# Patient Record
Sex: Female | Born: 1970
Health system: Southern US, Community
[De-identification: ages and names within clinical notes are randomized; demographics above are authoritative.]

## PROBLEM LIST (undated history)

## (undated) DIAGNOSIS — I739 Peripheral vascular disease, unspecified: Secondary | ICD-10-CM

## (undated) DIAGNOSIS — K219 Gastro-esophageal reflux disease without esophagitis: Secondary | ICD-10-CM

## (undated) DIAGNOSIS — K760 Fatty (change of) liver, not elsewhere classified: Secondary | ICD-10-CM

## (undated) DIAGNOSIS — B9681 Helicobacter pylori [H. pylori] as the cause of diseases classified elsewhere: Secondary | ICD-10-CM

## (undated) DIAGNOSIS — I370 Nonrheumatic pulmonary valve stenosis: Secondary | ICD-10-CM

## (undated) DIAGNOSIS — R011 Cardiac murmur, unspecified: Secondary | ICD-10-CM

## (undated) DIAGNOSIS — K2970 Gastritis, unspecified, without bleeding: Secondary | ICD-10-CM

## (undated) DIAGNOSIS — L989 Disorder of the skin and subcutaneous tissue, unspecified: Secondary | ICD-10-CM

## (undated) DIAGNOSIS — Q256 Stenosis of pulmonary artery: Secondary | ICD-10-CM

## (undated) DIAGNOSIS — Z9889 Other specified postprocedural states: Secondary | ICD-10-CM

## (undated) DIAGNOSIS — I272 Pulmonary hypertension, unspecified: Secondary | ICD-10-CM

## (undated) DIAGNOSIS — R0609 Other forms of dyspnea: Secondary | ICD-10-CM

## (undated) DIAGNOSIS — R0602 Shortness of breath: Secondary | ICD-10-CM

## (undated) DIAGNOSIS — K589 Irritable bowel syndrome without diarrhea: Secondary | ICD-10-CM

## (undated) HISTORY — DX: Fatty (change of) liver, not elsewhere classified: K76.0

## (undated) HISTORY — DX: Gastritis, unspecified, without bleeding: K29.70

## (undated) HISTORY — DX: Peripheral vascular disease, unspecified: I73.9

## (undated) HISTORY — DX: Stenosis of pulmonary artery: Q25.6

## (undated) HISTORY — DX: Shortness of breath: R06.02

## (undated) HISTORY — PX: DILATION AND CURETTAGE OF UTERUS: SHX78

## (undated) HISTORY — DX: Other specified postprocedural states: Z98.890

## (undated) HISTORY — DX: Cardiac murmur, unspecified: R01.1

## (undated) HISTORY — DX: Helicobacter pylori (H. pylori) as the cause of diseases classified elsewhere: B96.81

## (undated) HISTORY — DX: Other forms of dyspnea: R06.09

## (undated) HISTORY — PX: TONSILLECTOMY: SUR1361

## (undated) HISTORY — DX: Nonrheumatic pulmonary valve stenosis: I37.0

## (undated) HISTORY — DX: Disorder of the skin and subcutaneous tissue, unspecified: L98.9

## (undated) HISTORY — DX: Irritable bowel syndrome, unspecified: K58.9

## (undated) HISTORY — DX: Gastro-esophageal reflux disease without esophagitis: K21.9

## (undated) HISTORY — PX: OTHER SURGICAL HISTORY: SHX169

## (undated) HISTORY — DX: Pulmonary hypertension, unspecified: I27.20

## (undated) HISTORY — PX: ENDOMETRIAL ABLATION: SHX621

---
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
## 1995-09-01 HISTORY — PX: CHOLECYSTECTOMY: SHX55

## 2001-03-22 ENCOUNTER — Encounter: Payer: Self-pay | Admitting: Pediatrics

## 2001-03-22 ENCOUNTER — Ambulatory Visit (HOSPITAL_COMMUNITY): Admission: RE | Admit: 2001-03-22 | Discharge: 2001-03-22 | Payer: Self-pay | Admitting: Pediatrics

## 2001-04-21 ENCOUNTER — Ambulatory Visit (HOSPITAL_COMMUNITY): Admission: RE | Admit: 2001-04-21 | Discharge: 2001-04-21 | Payer: Self-pay | Admitting: Internal Medicine

## 2001-04-21 ENCOUNTER — Encounter (INDEPENDENT_AMBULATORY_CARE_PROVIDER_SITE_OTHER): Payer: Self-pay | Admitting: Internal Medicine

## 2002-07-13 ENCOUNTER — Ambulatory Visit (HOSPITAL_COMMUNITY): Admission: RE | Admit: 2002-07-13 | Discharge: 2002-07-13 | Payer: Self-pay | Admitting: Pediatrics

## 2002-07-13 ENCOUNTER — Encounter: Payer: Self-pay | Admitting: Pediatrics

## 2002-10-20 ENCOUNTER — Ambulatory Visit (HOSPITAL_COMMUNITY): Admission: RE | Admit: 2002-10-20 | Discharge: 2002-10-20 | Payer: Self-pay | Admitting: Pediatrics

## 2002-10-20 ENCOUNTER — Encounter: Payer: Self-pay | Admitting: Pediatrics

## 2003-03-07 ENCOUNTER — Encounter: Payer: Self-pay | Admitting: Pediatrics

## 2003-03-07 ENCOUNTER — Ambulatory Visit (HOSPITAL_COMMUNITY): Admission: RE | Admit: 2003-03-07 | Discharge: 2003-03-07 | Payer: Self-pay | Admitting: Pediatrics

## 2003-03-08 ENCOUNTER — Ambulatory Visit (HOSPITAL_COMMUNITY): Admission: RE | Admit: 2003-03-08 | Discharge: 2003-03-08 | Payer: Self-pay | Admitting: Pediatrics

## 2003-03-08 ENCOUNTER — Encounter: Payer: Self-pay | Admitting: Pediatrics

## 2003-09-01 DIAGNOSIS — Z9889 Other specified postprocedural states: Secondary | ICD-10-CM

## 2003-09-01 DIAGNOSIS — B9681 Helicobacter pylori [H. pylori] as the cause of diseases classified elsewhere: Secondary | ICD-10-CM

## 2003-09-01 HISTORY — DX: Helicobacter pylori (H. pylori) as the cause of diseases classified elsewhere: B96.81

## 2003-09-01 HISTORY — DX: Other specified postprocedural states: Z98.890

## 2003-10-22 ENCOUNTER — Ambulatory Visit (HOSPITAL_COMMUNITY): Admission: RE | Admit: 2003-10-22 | Discharge: 2003-10-22 | Payer: Self-pay | Admitting: Internal Medicine

## 2004-01-31 ENCOUNTER — Other Ambulatory Visit: Admission: RE | Admit: 2004-01-31 | Discharge: 2004-01-31 | Payer: Self-pay | Admitting: Obstetrics and Gynecology

## 2004-03-31 ENCOUNTER — Ambulatory Visit (HOSPITAL_COMMUNITY): Admission: RE | Admit: 2004-03-31 | Discharge: 2004-03-31 | Payer: Self-pay | Admitting: Obstetrics and Gynecology

## 2004-03-31 ENCOUNTER — Encounter (INDEPENDENT_AMBULATORY_CARE_PROVIDER_SITE_OTHER): Payer: Self-pay | Admitting: Specialist

## 2004-06-05 ENCOUNTER — Ambulatory Visit (HOSPITAL_COMMUNITY): Admission: RE | Admit: 2004-06-05 | Discharge: 2004-06-05 | Payer: Self-pay | Admitting: Internal Medicine

## 2004-09-09 ENCOUNTER — Other Ambulatory Visit: Admission: RE | Admit: 2004-09-09 | Discharge: 2004-09-09 | Payer: Self-pay | Admitting: Obstetrics and Gynecology

## 2005-03-13 ENCOUNTER — Inpatient Hospital Stay (HOSPITAL_COMMUNITY): Admission: AD | Admit: 2005-03-13 | Discharge: 2005-03-13 | Payer: Self-pay | Admitting: Obstetrics and Gynecology

## 2005-03-28 ENCOUNTER — Inpatient Hospital Stay (HOSPITAL_COMMUNITY): Admission: AD | Admit: 2005-03-28 | Discharge: 2005-03-30 | Payer: Self-pay | Admitting: Obstetrics and Gynecology

## 2005-05-07 ENCOUNTER — Other Ambulatory Visit: Admission: RE | Admit: 2005-05-07 | Discharge: 2005-05-07 | Payer: Self-pay | Admitting: Obstetrics and Gynecology

## 2006-06-02 ENCOUNTER — Ambulatory Visit: Payer: Self-pay | Admitting: Internal Medicine

## 2006-06-07 ENCOUNTER — Ambulatory Visit: Payer: Self-pay | Admitting: Internal Medicine

## 2006-06-14 ENCOUNTER — Encounter (HOSPITAL_COMMUNITY): Admission: RE | Admit: 2006-06-14 | Discharge: 2006-07-14 | Payer: Self-pay | Admitting: Internal Medicine

## 2007-02-03 ENCOUNTER — Encounter (HOSPITAL_COMMUNITY): Admission: RE | Admit: 2007-02-03 | Discharge: 2007-03-05 | Payer: Self-pay | Admitting: Sports Medicine

## 2007-03-07 ENCOUNTER — Encounter (HOSPITAL_COMMUNITY): Admission: RE | Admit: 2007-03-07 | Discharge: 2007-04-06 | Payer: Self-pay | Admitting: Sports Medicine

## 2007-10-10 ENCOUNTER — Ambulatory Visit (HOSPITAL_COMMUNITY): Admission: RE | Admit: 2007-10-10 | Discharge: 2007-10-10 | Payer: Self-pay | Admitting: *Deleted

## 2008-05-10 ENCOUNTER — Ambulatory Visit: Payer: Self-pay | Admitting: Gastroenterology

## 2008-05-25 ENCOUNTER — Ambulatory Visit (HOSPITAL_COMMUNITY): Admission: RE | Admit: 2008-05-25 | Discharge: 2008-05-25 | Payer: Self-pay | Admitting: Gastroenterology

## 2008-05-25 ENCOUNTER — Ambulatory Visit: Payer: Self-pay | Admitting: Gastroenterology

## 2008-05-25 ENCOUNTER — Encounter: Payer: Self-pay | Admitting: Gastroenterology

## 2008-05-25 HISTORY — PX: ESOPHAGOGASTRODUODENOSCOPY: SHX1529

## 2009-01-31 ENCOUNTER — Encounter (HOSPITAL_COMMUNITY): Admission: RE | Admit: 2009-01-31 | Discharge: 2009-03-02 | Payer: Self-pay | Admitting: Orthopedic Surgery

## 2009-05-29 ENCOUNTER — Encounter: Payer: Self-pay | Admitting: Gastroenterology

## 2010-09-11 ENCOUNTER — Encounter (INDEPENDENT_AMBULATORY_CARE_PROVIDER_SITE_OTHER): Payer: Self-pay | Admitting: *Deleted

## 2010-10-02 NOTE — Letter (Signed)
Summary: Recall Office Visit  [facility]  [address]   [place], [place] [address]   Phone: [phone]  Fax: [phone]      September 11, 2010   Lisa Garner [address] [place], [place]  [address] [birth year]   Dear Ms. Kondo,   According to our records, it is time for you to schedule a follow-up office visit with [place].   At your convenience, please call [phone] to schedule an office visit. If you have any questions, concerns, or feel that this letter is in error, we would appreciate your call.   Sincerely,    Diana Eves  [facility] Ph: [phone]   Fax: [phone]

## 2010-11-21 ENCOUNTER — Other Ambulatory Visit: Payer: Self-pay | Admitting: Obstetrics and Gynecology

## 2011-01-13 NOTE — Op Note (Signed)
Lisa Garner, Lisa Garner NO.:  [id number]   MEDICAL RECORD NO.:  [id number]          PATIENT TYPE:  AMB   LOCATION:  DAY                           FACILITY:  [facility]   PHYSICIAN:  Kassie Mends, M.D.      DATE OF BIRTH:  09-Jul-1971   DATE OF PROCEDURE:  DATE OF DISCHARGE:                                PROCEDURE NOTE   REFERRING PHYSICIAN:  Francoise Schaumann. Halm, DO, FAAP   PROCEDURE:  Esophagogastroduodenoscopy with cold forceps biopsy of the  gastric and duodenal mucosa.   INDICATION FOR EXAM:  Lisa Garner is a 40 year old female who has  persistent epigastric abdominal pain.  She has a significant past  medical history of irritable bowel syndrome and cholecystectomy.  An  ERCP for common bile duct stones and gallstones.  Recent lab evaluation  showed a normal hemoglobin and hepatic function panel.  She had a  gastric emptying study in October 2007 due to persistent nausea which  was normal.  Her last imaging study was in July 2004 for left flank  pain, and she had no acute abdominal or pelvic process. Her body mass  index is 35.5 (severely obese).   FINDINGS:  1. Normal esophagus without evidence of Barrett, mass, erosion,      ulceration, or stricture.  2. Patchy erythema in the antrum without erosion or ulceration.      Biopsies obtained via cold forceps to evaluate for H. pylori      gastritis for eosinophilic gastritis.  3. Normal duodenal bulb and second portion of the duodenum with      moderate bile staining.  Biopsies obtained via cold forceps to      evaluate for eosinophilic duodenitis or celiac sprue.   DIAGNOSES:  The most likely etiology for Lisa Garner's abdominal pain  is irritable bowel syndrome, constipation predominant.  The differential  diagnosis includes H. pylori gastritis, eosinophilic gastroenteritis, or  celiac sprue.   RECOMMENDATIONS:  1. She should continue the omeprazole.  2. Will call her with the results of her biopsies.  She  should have a      follow up appointment to see Lisa Garner in 6 weeks regarding her      abdominal pain.  She should follow a high-fiber diet.  She should      drink 6-8 the cups of water daily.  She should add Benefiber daily.  3. No aspirin or NSAIDs for 30 days.  She should avoid gastric      irritants.  No anticoagulation for 7 days.   MEDICATIONS:  1. Demerol 75 mg IV.  2. Versed 3 mg IV.   PROCEDURE TECHNIQUE:  Physical exam was performed.  Informed consent was  obtained from the patient explaining the benefits, risks, and  alternatives to the procedure.  The patient was connected to monitor and  placed in the left lateral position.  Continuous oxygen was provided by  nasal cannula.  IV medicine administered through an indwelling cannula.  After administration of sedation, the patient's esophagus was intubated  and the scope was advanced under direct visualization to the second  portion  of the duodenum.  The scope was removed slowly by carefully  examining the color, texture, anatomy, and integrity of the mucosa on  the way out.  The patient was recovered in endoscopy and discharged home  in satisfactory condition.   PATH:  Nl small bowel. Reactive gastropathy.      Kassie Mends, M.D.  Electronically Signed     SM/MEDQ  D:  05/25/2008  T:  05/25/2008  Job:  [id number]   cc:   Francoise Schaumann. Milford Cage DO, FAAP  Fax: [phone]

## 2011-01-13 NOTE — H&P (Signed)
NAME:  EVANEE, LUBRANO NO.:  [id number]   MEDICAL RECORD NO.:  [id number]          PATIENT TYPE:  AMB   LOCATION:  DAY                           FACILITY:  [facility]   PHYSICIAN:  Kassie Mends, M.D.      DATE OF BIRTH:  09/07/1970   DATE OF ADMISSION:  DATE OF DISCHARGE:  LH                              HISTORY & PHYSICAL   PRIMARY CARE PHYSICIAN:  Dr. Milford Cage.   CHIEF COMPLAINT:  Chronic nausea and epigastric pain.   HISTORY OF PRESENT ILLNESS:  Ms. Nghiem is a 40 year old Caucasian  female.  She was previously evaluated by Dr. Karilyn Cota.  Workup in the past  has included a gastric emptying study in 2007 which was normal.  She has  been treated for H. pylori gastritis back in 2005.  She had an EGD in  2005 by Dr. Karilyn Cota where she had hyperplastic polyp removed but was  otherwise normal.  She had negative small bowel follow-through in 2002  and a negative CT of the abdomen and pelvis with contrast in 2005 and a  negative celiac antibody previously.  More recently she has had a 3 week  history of severe epigastric discomfort.  She describes it as a balling  up type pain and points to her epigastrium.  She says it feels like it  could possibly be a spasm.  She has episodes of pain at least once per  month.  She describes the pain as excruciating and rates it 10/10 on a  pain scale.  It does not seem to be associated with eating or exercise  or movement, however, she has had at times when she bends over noticed  the onset of pain.  She complains of chronic nausea.  She denies any  vomiting.  The pain does not radiate to her back or shoulders.  She  denies any water brash or significant heartburn or indigestion.  She  denies any anorexia.  She has had fatigue for a couple of years.  She  has seen Dr. Domingo Sep for pulmonary stenosis which was congenital.  She  denies any rectal bleeding or melena.  She has been off of PPI and did  resume omeprazole 20 mg daily about 1 week  ago which does seem to help  some.   PAST MEDICAL AND SURGICAL HISTORY:  She had an ERCP with sphincterotomy  and stone extraction and cholecystectomy in 1997.  She has congenital  pulmonary stenosis and she has a history of IBS, D&C, tonsillectomy and  knee surgery.  She has had GI workup as above.   CURRENT MEDICATIONS:  1. Maxalt p.r.n.  2. Calcium once daily.  3. Magnesium once daily.  4. Multivitamin daily.  5. Omeprazole 20 mg daily.   ALLERGIES:  LEVBID.   FAMILY HISTORY:  There is no known family history of colorectal  carcinoma or liver problems.  Her father does have history of celiac  disease diagnosed late in life.  Mother has history of adenomatous colon  polyps.  She has three healthy siblings.   SOCIAL HISTORY:  Ms. Persky is married.  She has  two children ages 41  and 36.  She is the Nurse, adult for [employer] .  She denies any  tobacco or drug use.  She consumes a couple of alcoholic beverages per  month.   REVIEW OF SYSTEMS:  See HPI.  EXTREMITIES:  She has had some lower  extremity weakness and pain.  She has been seen by Dr. Domingo Sep.  She  did have a workup which was negative for blood clots.  She tells me  she had Dopplers to both legs as well as an abdominal ultrasound through  Dr. Roque Lias office.  Otherwise, negative review of systems.  See HPI.   PHYSICAL EXAMINATION:  VITAL SIGNS:  Weight 207 pounds, height 54  inches, temperature 97.8, blood pressure 104/68 and pulse 60.  GENERAL:  She is an obese pale-appearing Caucasian female who is alert  and pleasant, cooperative and in no acute distress.  HEENT:  Sclerae clear and nonicteric.  Conjunctivae pink.  Oropharynx  pink and moist without lesions.  NECK:  Supple without mass or thyromegaly.  CHEST:  Heart regular rate and rhythm.  Normal S1-S2.  No murmurs,  clicks, rubs or gallops.  LUNGS:  Clear to auscultation bilaterally.  ABDOMEN:  Positive bowel sounds x4.  No bruits auscultated.   Soft,  nontender, nondistended.  No palpable mass or splenomegaly.  No rebound  tenderness or guarding.  EXTREMITIES:  Without clubbing or edema.   IMPRESSION:  Ms. Chernick is a 40 year old Caucasian female with a 3  week history of epigastric pain.  She has normally had episodes of  epigastric pain about once per month.  This pain is excruciating.  It is  sometimes worsened with bending over.  It is also associated with  chronic nausea without emesis.  She has had minimal relief with PPI and  extensive GI workup previously.  She has dyspepsia which could be  related to peptic ulcer disease or non-ulcer dyspepsia.  She is status  post cholecystectomy, ERCP with sphincterotomy and stone extraction back  in 1997.   PLAN:  1. Omeprazole 20 mg daily.  2. EGD with Dr. Cira Servant in the near future.  Discussed the procedure      risks and benefits, which include but are not limited to bleeding,      infection, perforation, drug reaction and she agrees and informed      consent will be obtained.  3. CBC, met-7 and LFTs today.      [name], N.P.      Kassie Mends, M.D.  Electronically Signed    KJ/MEDQ  D:  05/10/2008  T:  05/10/2008  Job:  [id number]   cc:   Francoise Schaumann. Milford Cage DO, FAAP  Fax: [phone]

## 2011-01-16 NOTE — Op Note (Signed)
NAME:  Lisa Garner, Lisa Garner                     ACCOUNT NO.:  [id number]   MEDICAL RECORD NO.:  [id number]                   PATIENT TYPE:  AMB   LOCATION:  SDC                                  FACILITY:  [facility]   PHYSICIAN:  Michelle L. Vincente Poli, M.D.            DATE OF BIRTH:  Mar 25, 1971   DATE OF PROCEDURE:  03/31/2004  DATE OF DISCHARGE:  03/31/2004                                 OPERATIVE REPORT   PREOPERATIVE DIAGNOSIS:  Missed abortion.   POSTOPERATIVE DIAGNOSIS:  Missed abortion.   OPERATION PERFORMED:  Dilation and evacuation.   SURGEON:  Michelle L. Vincente Poli, M.D.   ANESTHESIA:  MAC with paracervical block.   ESTIMATED BLOOD LOSS:  Less than 50 mL.   DESCRIPTION OF PROCEDURE:  The patient was taken to the operating room.  She  was given sedation and placed in lithotomy position.  She was prepped and  draped in the usual sterile fashion.  In and out catheter was used to empty  the bladder.  A speculum was inserted into the vagina and the cervix was  grasped with a tenaculum and a paracervical block was performed in the  standard fashion.  The uterus was sounded to 8 cm and was in the midline  position.  The cervical internal os was gently dilated using Pratt dilators.  A #7 suction cannula was inserted into the uterus and suction curettage was  obtained with retrieval of contents grossly consistent with products of  conception.  This was performed two times.  The suction cannula was removed  and a sharp curet was then inserted and the uterus was thoroughly curetted  until no final tissue was noted.  A final suction curettage was then  performed.  At the end of the procedure, there was no vaginal bleeding  noted.  All sponge, lap and instrument counts were correct times two.  The  patient went to the recovery room in stable condition.  Of note, the patient  has a history of pulmonary artery stenosis and was given antibiotics prior  to the procedure and Lisa Garner be sent home with  antibiotics to take this evening  as well as Tylenol with codeine.                                               Michelle L. Vincente Poli, M.D.    Florestine Avers  D:  04/02/2004  T:  04/02/2004  Job:  [id number]

## 2011-01-16 NOTE — Op Note (Signed)
NAME:  KELLIN, FIFER                     ACCOUNT NO.:  [id number]   MEDICAL RECORD NO.:  [id number]                   PATIENT TYPE:  AMB   LOCATION:  SDC                                  [facility]   PHYSICIAN:  Michelle L. Vincente Poli, M.D.            DATE OF BIRTH:  Mar 06, 1971   DATE OF PROCEDURE:  DATE OF DISCHARGE:                                 OPERATIVE REPORT   __________________   The suction curet was removed and then with a sharp curet the uterus was  thoroughly curetted.  All four uterine walls were gritty.  We then performed  a final suction curettage for very scant tissue and debris.  At the end of  the procedure, there was no vaginal bleeding noted.  All sponge, lap and  instrument counts were correct x2.  The patient went to the recovery room in  stable condition.  Of note, ____________.  The patient was given antibiotics  during the procedure and will be sent home with antibiotics to take  postoperatively, as well as Methergine and Tylenol.                                               Michelle L. Vincente Poli, M.D.    Florestine Avers  D:  03/31/2004  T:  03/31/2004  Job:  [id number]

## 2011-01-16 NOTE — Consult Note (Signed)
Lisa Garner, Lisa Garner NO.:  [id number]   MEDICAL RECORD NO.:  [id number]                  PATIENT TYPE:   LOCATION:                                       FACILITY:   PHYSICIAN:  Lionel December, M.D.                 DATE OF BIRTH:  06-Jun-1971   DATE OF CONSULTATION:  10/16/2003  DATE OF DISCHARGE:                                   CONSULTATION   GI CONSULT   REQUESTING PHYSICIAN:  Dr. Francoise Schaumann. Halm.   CHIEF COMPLAINT:  Epigastric pain and nausea.   HISTORY OF PRESENT ILLNESS:  This patient is a 39 year old Caucasian female  who reports to our office with intermittent history of several years of  epigastric and left upper quadrant abdominal pain which radiates through to  her back in her left shoulder blade.  She also reports nausea which is  persistent all day; however, worse postprandially as well as increased  abdominal bloating and flatulence.  She was seen by Dr. Milford Cage for similar  symptoms in July of 2004.  She was found to be H. pylori positive and was  treated with triple drug therapy.  She denies any emesis.  She denies any  heartburn, indigestion, or regurgitation of food. She denies any dysphagia  or odynophagia.  She reports that the pain has been worse over the last 2  weeks.  She reports that her bowel movements have been normal, every other  day, soft and brown without any diarrhea, constipation, melena, or rectal  bleeding.  She is status post laparoscopic cholecystectomy in 1997 secondary  to cholelithiasis with choledocholithiasis.  She had celiac profile drawn in  2003 which was negative.  She has been followed by Dr. Karilyn Cota for IBS as  well.  She recently started Prevacid 30 mg daily, 2 weeks ago, and has noted  minimal relief in her pain.  She denies any aspirin or NSAID use other than  occasional ibuprofen.   PAST MEDICAL HISTORY:  1. IBS.  2. Pulmonary stenosis.  3. Heart murmur.  4. Status post cholecystectomy for  choledocholithiasis and cholelithiasis.   CURRENT MEDICATIONS:  1. Triphasil birth control pill.  2. Levsin p.r.n.  3. Trimox 500 mg p.r.n.  4. Multivitamin daily.  5. Prevacid 30 mg daily.   FAMILY HISTORY:  No known family history of colorectal carcinoma, liver, or  chronic GI problems.  Mother age 97 and is healthy.  Father does have  history of celiac disease and is age 51.  She has 2 brothers and 1 sister in  good health.   SOCIAL HISTORY:  The patient has been married for 2 years and has an 87-1/2-  year-old daughter who is healthy.  She is employed full-time with [employer].  She denies any tobacco or drug history.  She does report  occasional alcoholic beverage, usually once a week.   REVIEW OF SYSTEMS:  CONSTITUTIONAL:  Weight  is stable.  Denies any fever or  chills.  Appetite is good.  CARDIOVASCULAR:  Denies any chest pain or  palpitations other than occasional gas pressure.  Does have history of  pulmonary stenosis and associated murmur.  GI:  See HPI.  SKIN:  Denies any  rash or jaundice.   PHYSICAL EXAMINATION:  VITAL SIGNS:  Weight 210 pounds.  Blood pressure  102/70, pulse 66.  GENERAL:  This patient is a 40 year old, obese, Caucasian female who is  alert, oriented, pleasant and cooperative.  HEENT:  Sclerae are clear.  Nonicteric.  Conjunctivae pink. Oropharynx pink  and moist without any lesions.  NECK:  Supple without any masses or thyromegaly.  HEART:  Regular rate and rhythm with 2/6 flow murmur.  LUNGS:  Clear to auscultation bilaterally.  ABDOMEN:  Rounded with positive bowel soundsx4.  Soft, nontender.  There is  a palpable lipoma in the left upper quadrant.  She does have visible striae.  No discrete mass or hepatosplenomegaly.  EXTREMITIES:  Good pulses bilaterally.  No edema.  SKIN:  Pale, warm and dry, without any rash or jaundice.   ASSESSMENT:  This patient is a 40 year old Caucasian female with significant  history of epigastric pain which  radiates through her left shoulder blade,  associated with postprandial nausea; symptoms possibly related to peptic  ulcer disease given positive H. pylori. Would be suspicious, may be an  element of poorly controlled reflux.  Also, given his of cholelithiasis  secondary to cholelithiasis and choledocholithiasis, would be concerned with  the latter.  Patient reports she had an abdominal ultrasound; however, I do  not have this record for my review.  Sphincter of Oddi dysfunction is also a  remote possibility.  Her symptoms are atypical for gastroparesis, as usually  this is painless.   RECOMMENDATIONS:  1. Have recommended further evaluation with EGD to be performed by Dr.     Karilyn Cota at [facility] in the near future.  I have discussed this     procedure including risks and benefits with the patient; including, but     not limited to, bleeding, perforation and infection.  She agrees with     this plan.  Consent will be obtained.  2. Would recommend continue PPI on a daily basis for now.  3. Further recommendations pending procedure.   We would like to thank Dr. Milford Cage for allowing [place] to participate in the care  of this patient.     ________________________________________  ___________________________________________  Nicholas Lose, N.P.                  Lionel December, M.D.   KC/MEDQ  D:  10/16/2003  T:  10/16/2003  Job:  [id number]   cc:   Lionel December, M.D.  [address]  [place]  [place] [address]  Fax: [phone]   Francoise Schaumann. Halm, D.O.  [address]  [place]  [place] [address]  Fax: [phone]

## 2011-01-16 NOTE — Op Note (Signed)
NAME:  Lisa Garner, Lisa Garner                     ACCOUNT NO.:  [id number]   MEDICAL RECORD NO.:  [id number]                   PATIENT TYPE:  AMB   LOCATION:  DAY                                  FACILITY:  [facility]   PHYSICIAN:  Lionel December, M.D.                 DATE OF BIRTH:  19-Aug-1971   DATE OF PROCEDURE:  10/22/2003  DATE OF DISCHARGE:                                 OPERATIVE REPORT   PROCEDURE:  Esophagogastroduodenoscopy.   INDICATIONS FOR PROCEDURE:  [name] is a 40 year old Caucasian female who has  a several month history of epigastric pain and nausea. Last July she was  treated with __________ for H. pylori gastritis.  She has been maintained on  PPI but is still not feeling better. She also has IBS and is using Levsin on  a sporadic basis. She is status post cholecystectomy in 1997.  The procedure  is reviewed with the patient and informed consent was obtained.   PREOP MEDICATIONS:  Demerol 50 mg IV, Versed 10 mg IV.   FINDINGS:  Procedure performed in endoscopy suite. The patient's vital signs  and O2 sat were monitored during the procedure and remained stable. The  patient was placed in the left lateral decubitus position and Olympus  videoscope was passed through oropharynx without any difficulty into the  esophagus.   ESOPHAGUS:  The mucosa of the esophagus normal throughout.  The  squamocolumnar junction was unremarkable.  No hernia was noted.   STOMACH:  It was empty and distended very well with insufflation. Folds of  the proximal stomach were normal. Examination of the mucosa revealed no  erosions or ulcers.  There was a 6 mm polyp a the fundus seen on retroflexed  view.  Endoscopically about 5 or 6 mm.  It was snared.  Most of the polyp  was coagulated during this process but a sample was obtained for a tissue  diagnosis.  The cardia was unremarkable.   DUODENUM:  Examination of the bulb and post bulbar duodenum was normal.  The  endoscope was withdrawn.   The  patient tolerated the procedure well.   FINAL DIAGNOSES:  A small gastric polyp at fundus otherwise normal  esophagogastroduodenoscopy.  This polyp was snared.  I do not feel that has  anything to do with her symptoms.   RECOMMENDATIONS:  She will continue antireflux measures and Prevacid as  before.  Will try her on Levbid 1 tablet p.o. q.a.m.   If the patient remains with symptoms will consider small bowel study.      ___________________________________________                                            Lionel December, M.D.   NR/MEDQ  D:  10/22/2003  T:  10/22/2003  Job:  [id number]  cc:   Dr. Stephania Fragmin

## 2011-05-21 ENCOUNTER — Encounter: Payer: Self-pay | Admitting: Urgent Care

## 2011-05-21 ENCOUNTER — Ambulatory Visit (INDEPENDENT_AMBULATORY_CARE_PROVIDER_SITE_OTHER): Payer: Managed Care, Other (non HMO) | Admitting: Urgent Care

## 2011-05-21 DIAGNOSIS — R109 Unspecified abdominal pain: Secondary | ICD-10-CM | POA: Insufficient documentation

## 2011-05-21 DIAGNOSIS — R110 Nausea: Secondary | ICD-10-CM | POA: Insufficient documentation

## 2011-05-21 LAB — CBC WITH DIFFERENTIAL/PLATELET
HCT: 41.7 % (ref 36.0–46.0)
Hemoglobin: 13.5 g/dL (ref 12.0–15.0)
Lymphocytes Relative: 24 % (ref 12–46)
Lymphs Abs: 1.9 10*3/uL (ref 0.7–4.0)
MCV: 90.7 fL (ref 78.0–100.0)
Monocytes Absolute: 0.5 10*3/uL (ref 0.1–1.0)
Monocytes Relative: 6 % (ref 3–12)
Neutro Abs: 5.4 10*3/uL (ref 1.7–7.7)
WBC: 7.9 10*3/uL (ref 4.0–10.5)

## 2011-05-21 LAB — COMPREHENSIVE METABOLIC PANEL
AST: 17 U/L (ref 0–37)
Albumin: 4.1 g/dL (ref 3.5–5.2)
BUN: 9 mg/dL (ref 6–23)
CO2: 30 mEq/L (ref 19–32)
Calcium: 9.6 mg/dL (ref 8.4–10.5)
Chloride: 101 mEq/L (ref 96–112)
Glucose, Bld: 98 mg/dL (ref 70–99)
Potassium: 4.3 mEq/L (ref 3.5–5.3)

## 2011-05-21 LAB — LIPASE: Lipase: 14 U/L (ref 0–75)

## 2011-05-21 NOTE — Patient Instructions (Signed)
Stop omeprazole Start DEXILANT 60mg  daily after pregnancy results back Go get labs right away IAbdominal Pain Abdominal pain can be caused by many things. Your caregiver decides the seriousness of your pain by an examination and possibly blood tests and X-rays. Many cases can be observed and treated at home. Most abdominal pain is not caused by a disease and will probably improve without treatment. However, in many cases, more time must pass before a clear cause of the pain can be found. Before that point, it may not be known if you need more testing, or if hospitalization or surgery is needed. HOME CARE INSTRUCTIONS  Do not take laxatives unless directed by your caregiver.   Take pain medicine only as directed by your caregiver.   Only take over-the-counter or prescription medicines for pain, discomfort, or fever as directed by your caregiver.   Try a clear liquid diet (broth, tea, or water) for 1-2days or as ordered by your caregiver. Slowly move to a bland diet as tolerated.  SEEK IMMEDIATE MEDICAL CARE IF:  The pain does not go away.   You or your child has an oral temperature above 101, not controlled by medicine.   You keep throwing up (vomiting).   The pain is felt only in portions of the abdomen. Pain in the right side could possibly be appendicitis. In an adult, pain in the left lower portion of the abdomen could be colitis or diverticulitis.   You pass bloody or black tarry stools.  MAKE SURE YOU:  Understand these instructions.   Will watch your condition.   Will get help right away if you are not doing well or get worse.  Document Released: 05/27/2005 Document Re-Released: 11/11/2009 [facility] Patient Information 2011 [place], [place]. will call w/ CT results

## 2011-05-21 NOTE — Assessment & Plan Note (Addendum)
Lisa Garner is a 40 y.o. caucasian female w/ hx IBS-C and gastritis who presents w/ 1 week hx severe generalized abdominal pain, most severe @ umbilicus & LUQ & nausea not responsive to PPI.  Differentials are wide & include IBS flare, pancreatitis, diverticulitis, early appendicitis, less likely obstruction, UTI, non-GI source of pain.    Labs U preg, UA, CBC, CMP, amylase, lipase CT abd/pelvis w/ IV/oral contrast Discontinue omeprazole Start dexilant 60mg  daily once negative pregnancy test confirmed To ER if severe pain.

## 2011-05-21 NOTE — Progress Notes (Signed)
Primary Care Physician:  Ara Kussmaul, MD Primary Gastroenterologist:  Dr. Darrick Penna  Chief Complaint  Patient presents with  . Nausea  . Abdominal Pain    HPI:  Lisa Garner is a 40 y.o. female here for further evaluation of abdominal pain & nausea.  She has been waking up every morning with severe nausea for at least one week.  Denies vomiting.  BM daily without rectal bleeding or melena.  She has severe upper abd pain that is constant 7-8.  It also radiates throughout her entire abdomen and she  "feels unsettled."  Denies heartburn or indigestion, dysphagia or odynophagia.  She had been off omeprazole 20mg  daily x couple wks.  she is now back on omeprazole 20mg  daily q AM x 1 month.  She has not noticed any difference in her symptoms since she started PPI. Denies fever or chills.    Wt up 4# in 3 yrs since seen in 2009.  Advil less than once per wk for headaches.  Hx headaches & migraines x 2 wks.  Sees Dr Milford Cage. Her Abd pain is made worse w/ eating.  Occasionally better w/ defecation.    Previous GI workup back in 2009-2005. Negative GES, fatty liver, IBS, EGD-gastritis, normal small bowel biopsy. Normal small bowel follow-through. Status post H. pylori treatment. ERCP with sphincterotomy/stone extraction 1997.  Past Medical History  Diagnosis Date  . Pulmonary stenosis   . IBS (irritable bowel syndrome)   . S/P endoscopy 05/26/11    Dr [place] Pink small bowel biopsy, reactive gastropathy  . Helicobacter pylori gastritis 2005  . S/P endoscopy 2005    Dr. Rosezetta Schlatter polyp  . GERD (gastroesophageal reflux disease)   . Fatty liver     Past Surgical History  Procedure Date  . Tonsillectomy   . Knee surgery     left  . Wisdom tooth extraction   . Cholecystectomy 1997    ERCP with sphincterotomy/stone extraction  . Dilation and curettage of uterus   . Endometrial ablation     Current Outpatient Prescriptions  Medication Sig Dispense Refill  . calcium carbonate  (OS-CAL) 600 MG TABS Take 600 mg by mouth 2 (two) times daily with a meal.        . cholecalciferol (VITAMIN D) 1000 UNITS tablet Take 1,000 Units by mouth daily.        . Multiple Vitamin (MULTIVITAMIN) capsule Take 1 capsule by mouth daily.        Marland Kitchen omeprazole (PRILOSEC) 20 MG capsule Take 20 mg by mouth daily.          Allergies as of 05/21/2011 - Review Complete 05/21/2011  Allergen Reaction Noted  . Levbid (hyoscyamine sulfate) Rash 05/21/2011    Family History  Problem Relation Age of Onset  . Celiac disease Father     Chandra Batch  . Leukemia Father   . Colon polyps Mother     History   Social History  . Marital Status: Married    Spouse Name: N/A    Number of Children: 2  . Years of Education: N/A   Occupational History  . Coordinator [employer]    Social History Main Topics  . Smoking status: Never Smoker   . Smokeless tobacco: Not on file  . Alcohol Use: Yes     socially one glass of wine per week  . Drug Use: No  . Sexually Active: Yes -- Female partner(s)     No birth control   Review of Systems: Gen: Denies  any fever, chills, sweats, anorexia, fatigue, weakness, malaise, weight loss, and sleep disorder CV: Denies chest pain, angina, palpitations, syncope, orthopnea, PND, peripheral edema, and claudication. Resp: Denies dyspnea at rest, dyspnea with exercise, cough, sputum, wheezing, coughing up blood, and pleurisy. GI: Denies vomiting blood, jaundice, and fecal incontinence.    GU : Denies urinary burning, blood in urine, urinary frequency, urinary hesitancy, nocturnal urination, and urinary incontinence. MS: Denies joint pain, limitation of movement, and swelling, stiffness, low back pain, extremity pain. Denies muscle weakness, cramps, atrophy.  Derm: Denies rash, itching, dry skin, hives, moles, warts, or unhealing ulcers.  Psych: Denies depression, anxiety, memory loss, suicidal ideation, hallucinations, paranoia, and confusion. Heme: Denies  bruising, bleeding, and enlarged lymph nodes.  Physical Exam: BP 130/85  Pulse 87  Temp(Src) 97.2 F (36.2 C) (Temporal)  Ht 5\' 4"  (1.626 m)  Wt 213 lb 9.6 oz (96.888 kg)  BMI 36.66 kg/m2  LMP 05/06/2011 General:   Alert,  Well-developed, well-nourished, pleasant and cooperative in NAD Head:  Normocephalic and atraumatic. Eyes:  Sclera clear, no icterus.   Conjunctiva pink. Ears:  Normal auditory acuity. Nose:  No deformity, discharge,  or lesions. Mouth:  No deformity or lesions, OP pink/moist. Neck:  Supple; no masses or thyromegaly. Lungs:  Clear throughout to auscultation.   No wheezes, crackles, or rhonchi. No acute distress. Heart:  Regular rate and rhythm; no murmurs, clicks, rubs,  or gallops. Abdomen:  Obese.  Soft and nondistended. +generalized tenderness on light/deep palpation, most pronounced LUQ & umbilicus.  No masses, hepatosplenomegaly or hernias noted. Normal bowel sounds, without guarding, and without rebound.   Rectal:  Deferred. Msk:  Symmetrical without gross deformities. Normal posture. Pulses:  Normal pulses noted. Extremities:  Without clubbing or edema. Neurologic:  Alert and  oriented x4;  grossly normal neurologically. Skin:  Intact without significant lesions or rashes. Cervical Nodes:  No significant cervical adenopathy. Psych:  Alert and cooperative. Normal mood and affect.

## 2011-05-21 NOTE — Assessment & Plan Note (Signed)
See ABD PAIN

## 2011-05-21 NOTE — Progress Notes (Signed)
Cc to PCP 

## 2011-05-22 LAB — URINALYSIS W MICROSCOPIC + REFLEX CULTURE
Bilirubin Urine: NEGATIVE
Protein, ur: NEGATIVE mg/dL
Squamous Epithelial / LPF: NONE SEEN
Urobilinogen, UA: 0.2 mg/dL (ref 0.0–1.0)

## 2011-05-22 LAB — PREGNANCY, URINE: Preg Test, Ur: NEGATIVE

## 2011-05-22 LAB — IGA: IgA: 203 mg/dL (ref 69–380)

## 2011-05-25 NOTE — Progress Notes (Signed)
Quick Note:  LMOM to call. ______ 

## 2011-05-26 ENCOUNTER — Ambulatory Visit (HOSPITAL_COMMUNITY)
Admission: RE | Admit: 2011-05-26 | Discharge: 2011-05-26 | Disposition: A | Discharge status: Home / Self Care | Payer: Managed Care, Other (non HMO) | Source: Ambulatory Visit | Attending: Urgent Care | Admitting: Urgent Care

## 2011-05-26 DIAGNOSIS — R110 Nausea: Secondary | ICD-10-CM | POA: Insufficient documentation

## 2011-05-26 DIAGNOSIS — R109 Unspecified abdominal pain: Secondary | ICD-10-CM | POA: Insufficient documentation

## 2011-05-26 MED ORDER — IOHEXOL 300 MG/ML  SOLN
100.0000 mL | Freq: Once | INTRAMUSCULAR | Status: AC | PRN
  Administered 2011-05-26: 100 mL via INTRAVENOUS

## 2011-05-26 NOTE — Progress Notes (Signed)
Quick Note:  This should have been done w/ IV AND ORAL CONTRAST. Please find out why ORAL contrast was not given? Thanks ______

## 2011-05-26 NOTE — Progress Notes (Signed)
Results Cc to Dr. Milford Cage

## 2011-05-27 ENCOUNTER — Encounter: Payer: Self-pay | Admitting: Urgent Care

## 2011-05-27 ENCOUNTER — Telehealth: Payer: Self-pay | Admitting: Gastroenterology

## 2011-05-27 NOTE — Progress Notes (Signed)
Pt returned call. She said she is still having the abdominal pain and feeling nauseated, but not sick enough to have vomiting. Please advise!

## 2011-05-27 NOTE — Progress Notes (Signed)
OV w/ me next available to discuss possible EGD

## 2011-05-27 NOTE — Progress Notes (Signed)
LMOM to call.

## 2011-05-27 NOTE — Progress Notes (Signed)
Please call pt & let her know CT was normal. Nothing to explain pain. Need Progress report on pt please CC; HALM,STEVEN J, MD copy of  CT

## 2011-05-27 NOTE — Telephone Encounter (Signed)
PT CALLED ABOUT HER CT RESULTS- SHE WOULD LIKE FOR [place] TO CALL HER AT [phone] WHEN THEY ARE READY

## 2011-05-28 NOTE — Progress Notes (Signed)
Informed pt and scheduled for OV on 05/29/2011 at 8:00 AM with Kandice.

## 2011-05-29 ENCOUNTER — Encounter: Payer: Self-pay | Admitting: Urgent Care

## 2011-05-29 ENCOUNTER — Ambulatory Visit (INDEPENDENT_AMBULATORY_CARE_PROVIDER_SITE_OTHER): Payer: Managed Care, Other (non HMO) | Admitting: Urgent Care

## 2011-05-29 DIAGNOSIS — R110 Nausea: Secondary | ICD-10-CM

## 2011-05-29 DIAGNOSIS — R109 Unspecified abdominal pain: Secondary | ICD-10-CM

## 2011-05-29 DIAGNOSIS — K589 Irritable bowel syndrome without diarrhea: Secondary | ICD-10-CM

## 2011-05-29 NOTE — Progress Notes (Signed)
Referring Provider: Ara Kussmaul, MD Primary Care Physician:  Ara Kussmaul, MD Primary Gastroenterologist:  Dr.  Darrick Penna  Chief Complaint  Patient presents with  . Abdominal Pain  . Nausea    HPI:  Lisa Garner is a 40 y.o. female here for follow up for abdominal pain & nausea.  She was started on dexilant 60mg  daily at last office visit & had a complete workup including labs & CT A/P w/ IV contrast that did not reveal a cause for her nausea & pain.  No difference w/ Dexilant.  C/o nausea, nothing settles right.  Intermittent pain, sharp at times, mid-abd, lasts few minutes, continues for hrs.  No vomiting. Upreg, lipase, amylase normal.  BM occasional loose, irreg.  Doesn't necessarily feel better after BM.  No heavy periods s/p ablation, regular periods continued even after ablation.  Negative UA, upreg, amylase & lipase.  TTG IgA & IgA normal.  Previous GI workup back in 2005-2009. Negative GES, fatty liver, IBS, EGD-gastritis, normal small bowel biopsy. Normal small bowel follow-through. Status post H. pylori treatment. ERCP with sphincterotomy/stone extraction 1997.    Lab Summary Latest Ref Rng 05/21/2011  Hemoglobin 12.0 - 15.0 g/dL 16.1  Hematocrit 09.6 - 46.0 % 41.7  White count 4.0 - 10.5 K/uL 7.9  Platelet count 150 - 400 K/uL 290  Sodium 135 - 145 mEq/L 140  Potassium 3.5 - 5.3 mEq/L 4.3  Calcium 8.4 - 10.5 mg/dL 9.6  Phosphorus  (None)  Creatinine 0.50 - 1.10 mg/dL 0.45  AST 0 - 37 U/L 17  Alk Phos 39 - 117 U/L 64  Bilirubin 0.3 - 1.2 mg/dL 0.3  Glucose 70 - 99 mg/dL 98  Cholesterol  (None)  HDL cholesterol  (None)  Triglycerides  (None)  LDL calc  (None)  LDL direct  (None)  Total protein 6.0 - 8.3 g/dL 6.6  Albumin 3.5 - 5.2 g/dL 4.1   Past Medical History  Diagnosis Date  . Pulmonary stenosis   . IBS (irritable bowel syndrome)   . S/P endoscopy 05/25/08    Dr [place] Pink small bowel biopsy, reactive gastropathy  . Helicobacter pylori gastritis 2005    . S/P endoscopy 2005    Dr. Rosezetta Schlatter polyp  . GERD (gastroesophageal reflux disease)   . Fatty liver    Past Surgical History  Procedure Date  . Tonsillectomy   . Knee surgery     left  . Wisdom tooth extraction   . Cholecystectomy 1997    ERCP with sphincterotomy/stone extraction  . Dilation and curettage of uterus   . Endometrial ablation    Current Outpatient Prescriptions  Medication Sig Dispense Refill  . calcium carbonate (OS-CAL) 600 MG TABS Take 600 mg by mouth 2 (two) times daily with a meal.        . cholecalciferol (VITAMIN D) 1000 UNITS tablet Take 1,000 Units by mouth daily.        Marland Kitchen dexlansoprazole (DEXILANT) 60 MG capsule Take 60 mg by mouth daily.        . Multiple Vitamin (MULTIVITAMIN) capsule Take 1 capsule by mouth daily.         Allergies as of 05/29/2011 - Review Complete 05/29/2011  Allergen Reaction Noted  . Levbid (hyoscyamine sulfate) Rash 05/21/2011   Review of Systems: See HPI, otherwise negative ROS.    Physical Exam: BP 127/83  Pulse 68  Temp(Src) 98 F (36.7 C) (Temporal)  Ht 5\' 4"  (1.626 m)  Wt 212 lb (96.163 kg)  BMI  36.39 kg/m2  LMP 05/06/2011 General:   Alert,  Well-developed, well-nourished, pleasant and cooperative in NAD Head:  Normocephalic and atraumatic. Eyes:  Sclera clear, no icterus.   Conjunctiva pink. Mouth:  No deformity or lesions, dentition normal. Neck:  Supple; no masses or thyromegaly. Heart:  Regular rate and rhythm; no murmurs, clicks, rubs,  or gallops. Abdomen:  Soft, nontender and nondistended. No masses, hepatosplenomegaly or hernias noted. Normal bowel sounds, without guarding, and without rebound.   Msk:  Symmetrical without gross deformities. Normal posture. Pulses:  Normal pulses noted. Extremities:  Without clubbing or edema. Neurologic:  Alert and  oriented x4;  grossly normal neurologically. Skin:  Intact without significant lesions or rashes. Cervical Nodes:  No significant cervical  adenopathy. Psych:  Alert and cooperative. Normal mood and affect.

## 2011-05-29 NOTE — Patient Instructions (Addendum)
Begin fiber supplement of choice_Benefiber, metamucil, etc Begin probiotics daily.Marland KitchenMarland KitchenMarland KitchenAlign, restora, or flora Q Return stool to lab ASAP Continue dexilant 60mg  daily 3 months office visit w/ Dr Darrick Penna, call if no better sooner

## 2011-05-29 NOTE — Assessment & Plan Note (Addendum)
Without vomiting.  Associated w/ upper abdominal pain/dyspepsia.  Minimal response w/ PPI.  Hx h pylori s/p treatment.  Normal GES previously.  Check H Pylori stool Ag. If negative, consider repeat EGD w/ possible BRAVO pH study. Continue dexilant 60mg  daily. Offered anti-emetic, pt declined.

## 2011-05-29 NOTE — Assessment & Plan Note (Addendum)
Likely culprit of abd pain.  Begin fiber supplement of choice_Benefiber, metamucil, etc Begin probiotics daily.Marland KitchenMarland KitchenMarland KitchenAlign, restora, or flora Q  3 months office visit w/ Dr Darrick Penna, call if no better sooner

## 2011-06-01 ENCOUNTER — Other Ambulatory Visit: Payer: Self-pay

## 2011-06-01 MED ORDER — DEXLANSOPRAZOLE 60 MG PO CPDR: 60.0000 mg | DELAYED_RELEASE_CAPSULE | Freq: Every day | ORAL | Status: DC

## 2011-06-01 NOTE — Progress Notes (Signed)
Cc to PCP 

## 2011-06-04 LAB — HELICOBACTER PYLORI  SPECIAL ANTIGEN: H. PYLORI Antigen: NEGATIVE

## 2011-06-04 NOTE — Progress Notes (Signed)
Quick Note:  LMOM to call. ______ 

## 2011-06-04 NOTE — Progress Notes (Signed)
Quick Note:  LMOM for pt to call. ______ 

## 2011-06-05 NOTE — Progress Notes (Signed)
Quick Note:  Mailed normal results letter. ______

## 2011-06-11 ENCOUNTER — Telehealth: Payer: Self-pay | Admitting: Gastroenterology

## 2011-06-11 NOTE — Telephone Encounter (Signed)
Spoke with pt- she has only tried omeprazole. She thinks she may have tried nexium but she is not sure. Pts insurance wants her to try/fail lansoprazole or pantoprazole AND omeprazole or nexium. Pt said if we want to go ahead and change her ppi to something else, she is fine with that.   PA form is on [employer].

## 2011-06-11 NOTE — Telephone Encounter (Signed)
Pt returned your call, she will be at home for the next hour

## 2011-06-12 MED ORDER — PANTOPRAZOLE SODIUM 40 MG PO TBEC: 40.0000 mg | DELAYED_RELEASE_TABLET | Freq: Every day | ORAL | Status: DC

## 2011-06-12 NOTE — Telephone Encounter (Signed)
Can try pantoprazole 40 mg daily  

## 2011-06-12 NOTE — Telephone Encounter (Signed)
Addended by: Joselyn Arrow on: 06/12/2011 08:25 AM   Modules accepted: Orders

## 2011-07-27 NOTE — Progress Notes (Signed)
OPV W/ SLF IN DEC 2012 E 30 VISIT, [id number]

## 2011-07-27 NOTE — Progress Notes (Signed)
NL CMP CBC H PYLORI STOOL Ag CT A/P.

## 2011-08-20 ENCOUNTER — Encounter: Payer: Self-pay | Admitting: Gastroenterology

## 2011-08-20 ENCOUNTER — Ambulatory Visit (INDEPENDENT_AMBULATORY_CARE_PROVIDER_SITE_OTHER): Payer: Managed Care, Other (non HMO) | Admitting: Gastroenterology

## 2011-08-20 VITALS — BP 129/80 | HR 72 | Temp 97.5°F | Ht 65.0 in | Wt 215.6 lb

## 2011-08-20 DIAGNOSIS — K2990 Gastroduodenitis, unspecified, without bleeding: Secondary | ICD-10-CM

## 2011-08-20 DIAGNOSIS — K2970 Gastritis, unspecified, without bleeding: Secondary | ICD-10-CM

## 2011-08-20 DIAGNOSIS — E669 Obesity, unspecified: Secondary | ICD-10-CM

## 2011-08-20 NOTE — Patient Instructions (Signed)
Follow a low fat diet. See info below. CUT YOUR PORTION SIZE. USE A SMALL PLATE NOT THE REGULAR ONE. EAT THE MEAT FIRST. STOP EATING WHEN YOU ARE FULL. Lose 15 lbs by next DEC. Continue Protonix. FOLLOW up DEC 2013.  Low-Fat Diet BREADS, CEREALS, PASTA, RICE, DRIED PEAS, AND BEANS These products are high in carbohydrates and most are low in fat. Therefore, they can be increased in the diet as substitutes for fatty foods. They too, however, contain calories and should not be eaten in excess. Cereals can be eaten for snacks as well as for breakfast.  Include foods that contain fiber (fruits, vegetables, whole grains, and legumes). Research shows that fiber may lower blood cholesterol levels, especially the water-soluble fiber found in fruits, vegetables, oat products, and legumes. FRUITS AND VEGETABLES It is good to eat fruits and vegetables. Besides being sources of fiber, both are rich in vitamins and some minerals. They help you get the daily allowances of these nutrients. Fruits and vegetables can be used for snacks and desserts. MEATS Limit lean meat, chicken, [place], and fish to no more than 6 ounces per day. Beef, Pork, and Lamb Use lean cuts of beef, pork, and lamb. Lean cuts include:  Extra-lean ground beef.  Arm roast.  Sirloin tip.  Center-cut ham.  Round steak.  Loin chops.  Rump roast.  Tenderloin.  Trim all fat off the outside of meats before cooking. It is not necessary to severely decrease the intake of red meat, but lean choices should be made. Lean meat is rich in protein and contains a highly absorbable form of iron. Premenopausal women, in particular, should avoid reducing lean red meat because this could increase the risk for low red blood cells (iron-deficiency anemia). Processed Meats Processed meats, such as bacon, bologna, salami, sausage, and hot dogs contain large quantities of fat, are not rich in valuable nutrients, and should not be eaten very often. Organ  Meats The organ meats, such as liver, sweetbreads, kidneys, and brain are very rich in cholesterol. They should be limited. Chicken and [place] These are good sources of protein. The fat of poultry can be reduced by removing the skin and underlying fat layers before cooking. Chicken and [place] can be substituted for lean red meat in the diet. Poultry should not be fried or covered with high-fat sauces. Fish and Shellfish Fish is a good source of protein. Shellfish contain cholesterol, but they usually are low in saturated fatty acids. The preparation of fish is important. Like chicken and [place], they should not be fried or covered with high-fat sauces. EGGS Egg yolks often are hidden in cooked and processed foods. Egg whites contain no fat or cholesterol. They can be eaten often. Try 1 to 2 egg whites instead of whole eggs in recipes or use egg substitutes that do not contain yolk. MILK AND DAIRY PRODUCTS Use skim or 1% milk instead of 2% or whole milk. Decrease whole milk, natural, and processed cheeses. Use nonfat or low-fat (2%) cottage cheese or low-fat cheeses made from vegetable oils. Choose nonfat or low-fat (1 to 2%) yogurt. Experiment with evaporated skim milk in recipes that call for heavy cream. Substitute low-fat yogurt or low-fat cottage cheese for sour cream in dips and salad dressings. Have at least 2 servings of low-fat dairy products, such as 2 glasses of skim (or 1%) milk each day to help get your daily calcium intake. FATS AND OILS Reduce the total intake of fats, especially saturated fat. Butterfat, lard, and  beef fats are high in saturated fat and cholesterol. These should be avoided as much as possible. Vegetable fats do not contain cholesterol, but certain vegetable fats, such as coconut oil, palm oil, and palm kernel oil are very high in saturated fats. These should be limited. These fats are often used in bakery goods, processed foods, popcorn, oils, and nondairy creamers.  Vegetable shortenings and some peanut butters contain hydrogenated oils, which are also saturated fats. Read the labels on these foods and check for saturated vegetable oils. Unsaturated vegetable oils and fats do not raise blood cholesterol. However, they should be limited because they are fats and are high in calories. Total fat should still be limited to 30% of your daily caloric intake. Desirable liquid vegetable oils are corn oil, cottonseed oil, olive oil, canola oil, safflower oil, soybean oil, and sunflower oil. Peanut oil is not as good, but small amounts are acceptable. Buy a heart-healthy tub margarine that has no partially hydrogenated oils in the ingredients. Mayonnaise and salad dressings often are made from unsaturated fats, but they should also be limited because of their high calorie and fat content. Seeds, nuts, peanut butter, olives, and avocados are high in fat, but the fat is mainly the unsaturated type. These foods should be limited mainly to avoid excess calories and fat. OTHER EATING TIPS Snacks  Most sweets should be limited as snacks. They tend to be rich in calories and fats, and their caloric content outweighs their nutritional value. Some good choices in snacks are graham crackers, melba toast, soda crackers, bagels (no egg), English muffins, fruits, and vegetables. These snacks are preferable to snack crackers, [place] fries, and chips. Popcorn should be air-popped or cooked in small amounts of liquid vegetable oil. Desserts Eat fruit, low-fat yogurt, and fruit ices instead of pastries, cake, and cookies. Sherbet, angel food cake, gelatin dessert, frozen low-fat yogurt, or other frozen products that do not contain saturated fat (pure fruit juice bars, frozen ice pops) are also acceptable.  COOKING METHODS Choose those methods that use little or no fat. They include: Poaching.  Braising.  Steaming.  Grilling.  Baking.  Stir-frying.  Broiling.  Microwaving.  Foods can be  cooked in a nonstick pan without added fat, or use a nonfat cooking spray in regular cookware. Limit fried foods and avoid frying in saturated fat. Add moisture to lean meats by using water, broth, cooking wines, and other nonfat or low-fat sauces along with the cooking methods mentioned above. Soups and stews should be chilled after cooking. The fat that forms on top after a few hours in the refrigerator should be skimmed off. When preparing meals, avoid using excess salt. Salt can contribute to raising blood pressure in some people. EATING AWAY FROM HOME Order entres, potatoes, and vegetables without sauces or butter. When meat exceeds the size of a deck of cards (3 to 4 ounces), the rest can be taken home for another meal. Choose vegetable or fruit salads and ask for low-calorie salad dressings to be served on the side. Use dressings sparingly. Limit high-fat toppings, such as bacon, crumbled eggs, cheese, sunflower seeds, and olives. Ask for heart-healthy tub margarine instead of butter.

## 2011-08-20 NOTE — Assessment & Plan Note (Signed)
AT RISK FOR NASH.  DISCUSSED RISK FOR CIRRHOSIS RELATED TO FATTY LIVER DISEASE. ENCOURAGED WEIGHT LOSS. EXPLAINE THE BENEFITS OF GETTING BMI < 30. Follow a low fat diet. See info below. CUT YOUR PORTION SIZE. USE A SMALL PLATE NOT THE REGULAR ONE. EAT THE MEAT FIRST. STOP EATING WHEN YOU ARE FULL. Lose 15 lbs by next DEC.

## 2011-08-20 NOTE — Progress Notes (Signed)
Cc to PCP 

## 2011-08-20 NOTE — Progress Notes (Signed)
  Subjective:    Patient ID: Lisa Garner, female    DOB: [birth year], 40 y.o.   MRN: [id number]  PCP: HALM  HPI [place] controlled 100% if she takes the emdicine. Was on OMP and manugfacturer changed and nmeds were less effective. No problmes, nausea, or vomiting. Bms: nl for her-once every day for 4-5 days but may go 2 days and noty have one. No abdominal painor chest pain. Appeetite: nl  Past Medical History  Diagnosis Date  . Pulmonary stenosis   . IBS (irritable bowel syndrome)   . S/P endoscopy 05/25/08    Dr [place] Pink small bowel biopsy, reactive gastropathy  . Helicobacter pylori gastritis 2005  . S/P endoscopy 2005    Dr. Rosezetta Schlatter polyp  . GERD (gastroesophageal reflux disease)   . Fatty liver     Past Surgical History  Procedure Date  . Tonsillectomy   . Knee surgery     left  . Wisdom tooth extraction   . Cholecystectomy 1997    ERCP with sphincterotomy/stone extraction  . Dilation and curettage of uterus   . Endometrial ablation     Allergies  Allergen Reactions  . Levbid (Hyoscyamine Sulfate) Rash    Current Outpatient Prescriptions  Medication Sig Dispense Refill  . calcium carbonate (OS-CAL) 600 MG TABS Take 600 mg by mouth 2 (two) times daily with a meal.      . cholecalciferol (VITAMIN D) 1000 UNITS tablet Take 1,000 Units by mouth daily.      . Multiple Vitamin (MULTIVITAMIN) capsule Take 1 capsule by mouth daily.      . pantoprazole (PROTONIX) 40 MG tablet Take 1 tablet (40 mg total) by mouth daily.        Review of Systems     Objective:   Physical Exam  Constitutional: She is oriented to person, place, and time. She appears well-developed and well-nourished. No distress.  HENT:  Head: Normocephalic and atraumatic.  Cardiovascular: Normal rate, regular rhythm and normal heart sounds.   Pulmonary/Chest: Effort normal and breath sounds normal. No respiratory distress.  Abdominal: Soft. Bowel sounds are normal. She exhibits no  distension. There is no tenderness.  Neurological: She is alert and oriented to person, place, and time.       NO FOCAL DEFICITS            Assessment & Plan:

## 2011-08-20 NOTE — Assessment & Plan Note (Signed)
CONTINUE PROTONIX. OPV IN 12 MOS.

## 2011-09-02 NOTE — Progress Notes (Signed)
Reminder in epic to follow up in one year °

## 2012-06-22 IMAGING — CT CT ABD-PELV W/ CM
2 of 4 series · 17 of 46 positions shown, 19 images · IV contrast (Omnipaque 300)
Comparison: Report of CT scan dated 03/08/2003

CLINICAL DATA: Abdominal pain and nausea. Previous cholecystectomy.

CT ABDOMEN AND PELVIS WITH CONTRAST
TECHNIQUE: Multidetector CT imaging of the abdomen and pelvis was
performed following the standard protocol during bolus
administration of intravenous contrast.
Contrast: 100mL OMNIPAQUE IOHEXOL 300 MG/ML IV SOLN

[Series 2: abd_pel_with 5.0 b40f · axial · 0.80mm/px · z∈[-513,-93]mm · 14 of 94 slices shown, 16 images]
[im 5/94  soft-tissue]
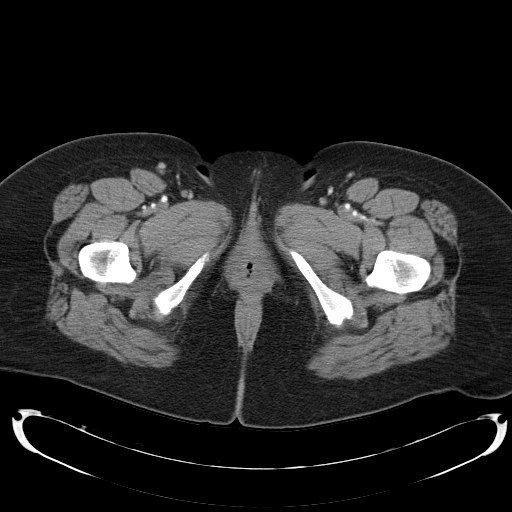
[im 5/94  bone]
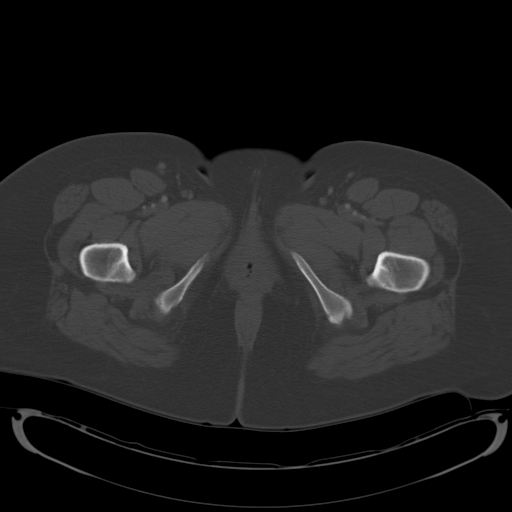
[im 13/94  soft-tissue]
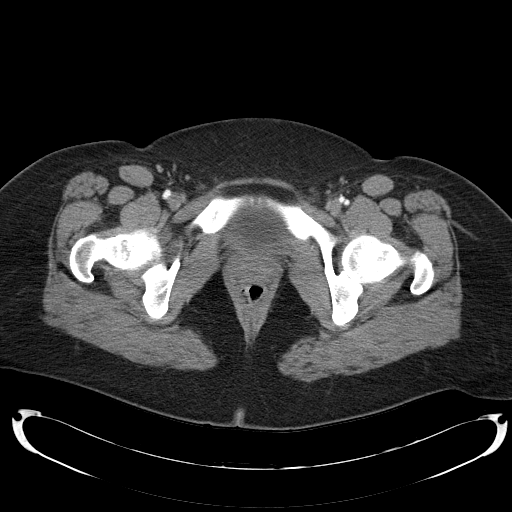
[im 17/94  soft-tissue]
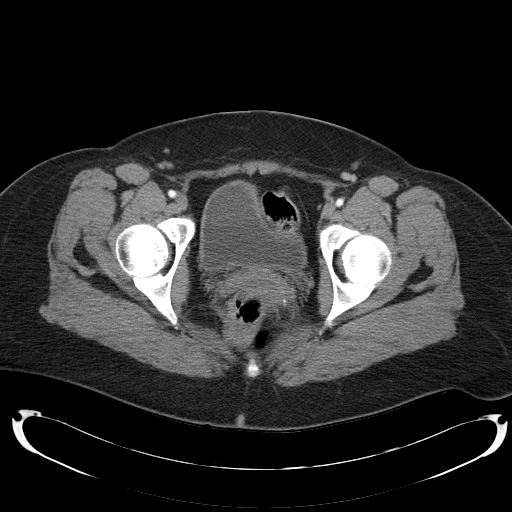
[im 25/94  soft-tissue]
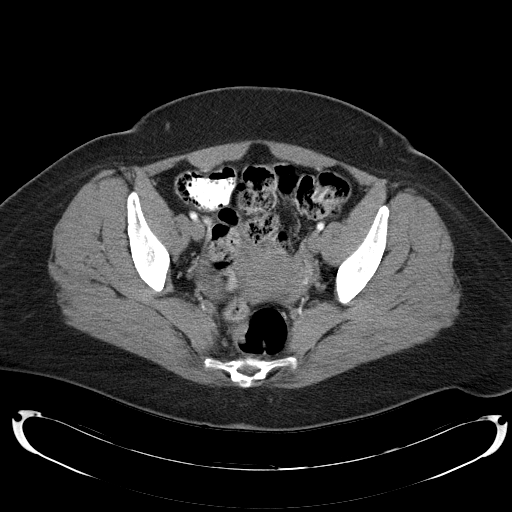
[im 33/94  soft-tissue]
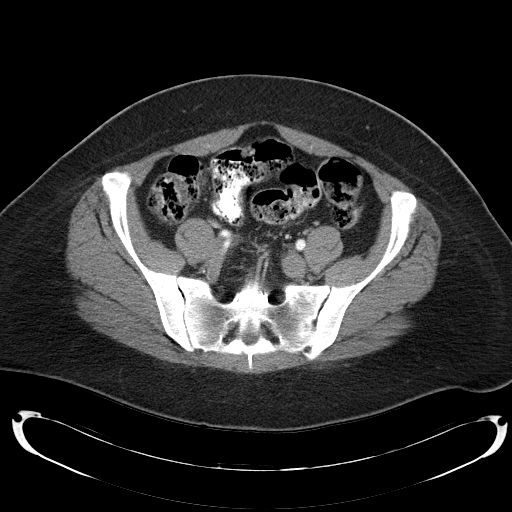
[im 37/94  soft-tissue]
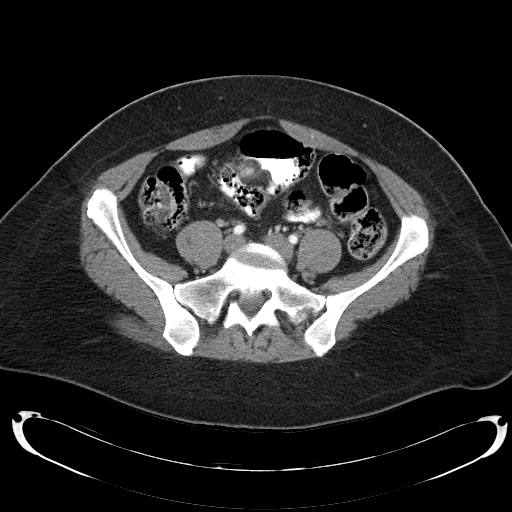
[im 45/94  soft-tissue]
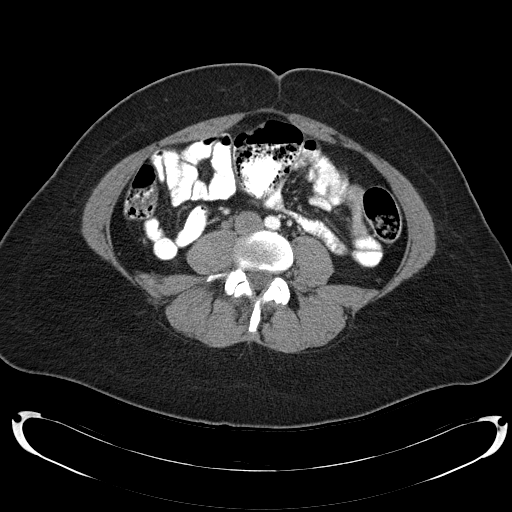
[im 49/94  soft-tissue]
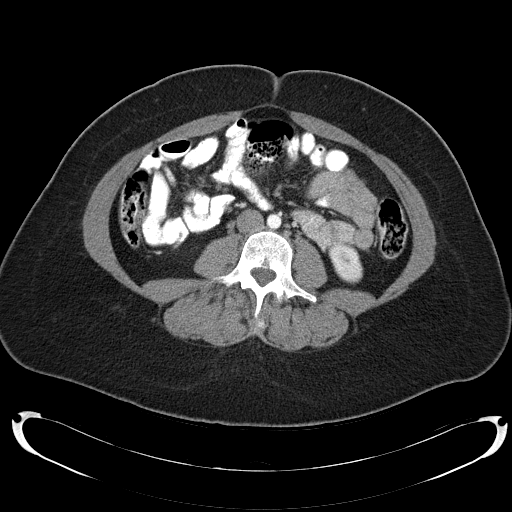
[im 57/94  soft-tissue]
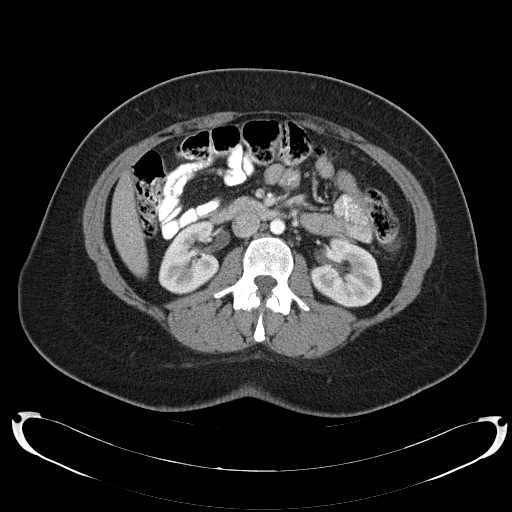
[im 57/94  bone]
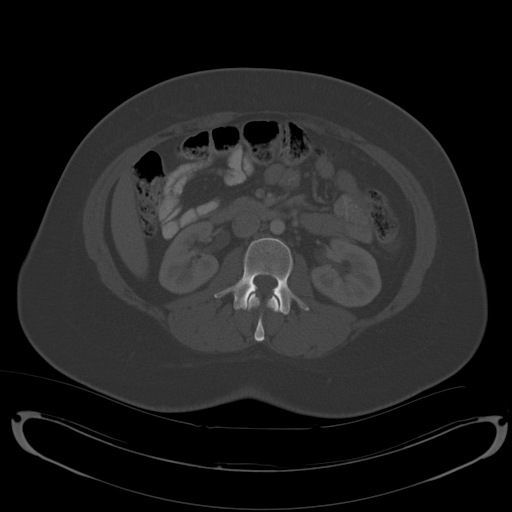
[im 61/94  soft-tissue]
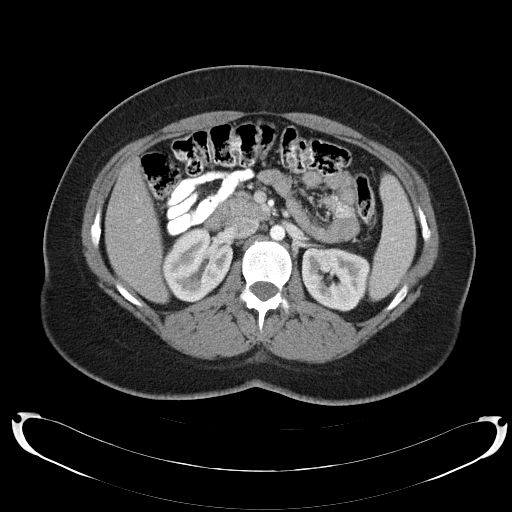
[im 69/94  soft-tissue]
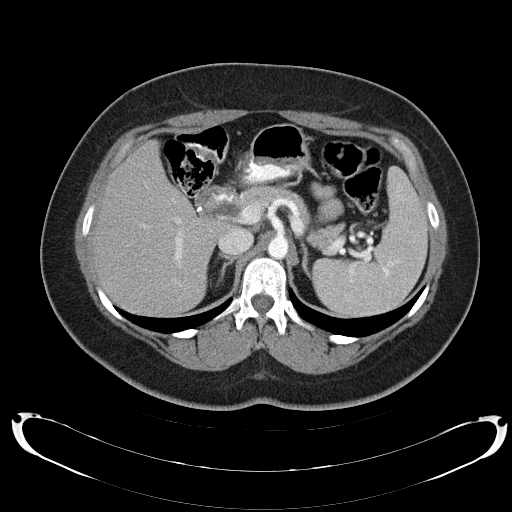
[im 77/94  soft-tissue]
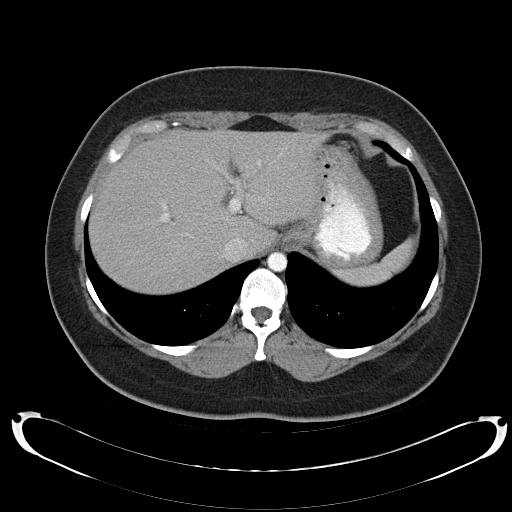
[im 81/94  soft-tissue]
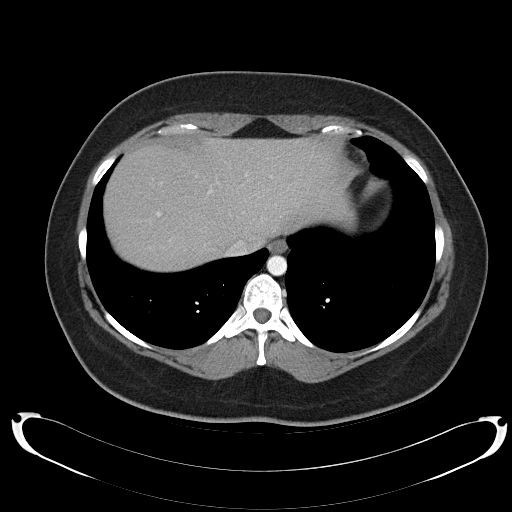
[im 89/94  soft-tissue]
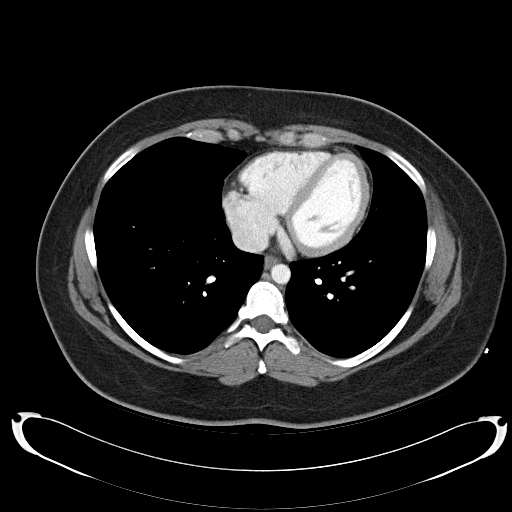

[Series 4: abd_pel_with 3.0 spo cor · coronal · 0.70mm/px · 3 of 89 slices shown]
[im 30/89  soft-tissue]
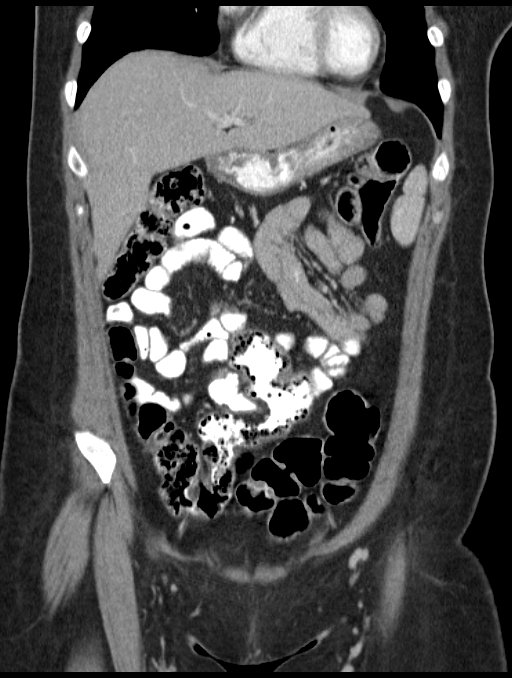
[im 40/89  soft-tissue]
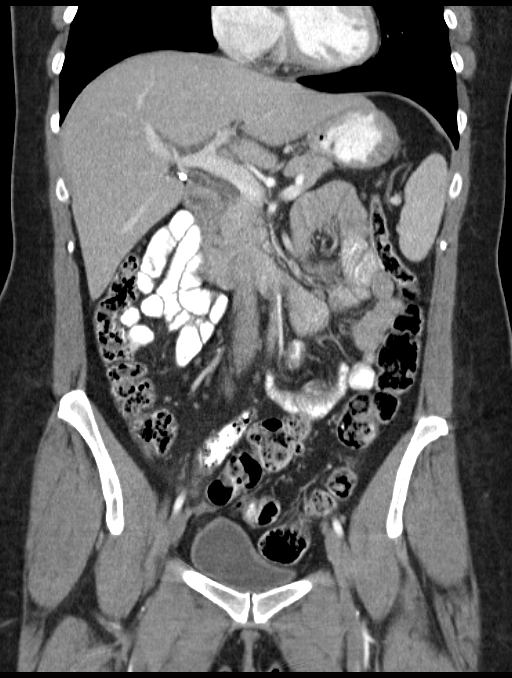
[im 49/89  soft-tissue]
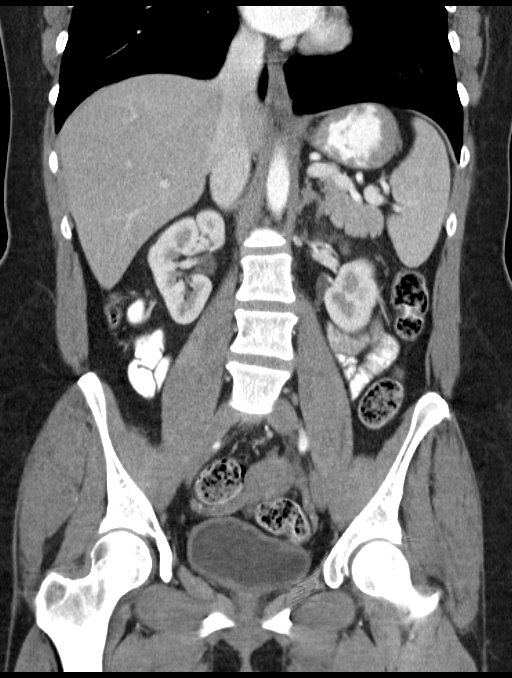

[17 of 46 positions shown; findings below may reference images not displayed]

FINDINGS: The liver, spleen, pancreas, adrenal glands, and kidneys
are normal.  The bowel is normal including the terminal ileum and
appendix.  The cecum and appendix lie in the midline.

There is a collapsed cyst or follicle on the left ovary.  Right
ovary is normal.  Uterus is normal.  No free air or free fluid.  No
osseous abnormality.
IMPRESSION: Benign-appearing abdomen and pelvis.

## 2012-08-01 ENCOUNTER — Encounter: Payer: Self-pay | Admitting: *Deleted

## 2013-10-19 ENCOUNTER — Other Ambulatory Visit: Payer: Self-pay | Admitting: Obstetrics and Gynecology

## 2014-12-13 ENCOUNTER — Other Ambulatory Visit: Payer: Self-pay | Admitting: Obstetrics and Gynecology

## 2014-12-14 LAB — CYTOLOGY - PAP

## 2014-12-28 ENCOUNTER — Encounter: Payer: Self-pay | Admitting: Nurse Practitioner

## 2014-12-28 ENCOUNTER — Other Ambulatory Visit: Payer: Self-pay

## 2014-12-28 ENCOUNTER — Ambulatory Visit (INDEPENDENT_AMBULATORY_CARE_PROVIDER_SITE_OTHER): Payer: BLUE CROSS/BLUE SHIELD | Admitting: Nurse Practitioner

## 2014-12-28 VITALS — BP 123/82 | HR 73 | Temp 97.0°F | Ht 64.0 in | Wt 200.0 lb

## 2014-12-28 DIAGNOSIS — R1084 Generalized abdominal pain: Secondary | ICD-10-CM | POA: Diagnosis not present

## 2014-12-28 DIAGNOSIS — R195 Other fecal abnormalities: Secondary | ICD-10-CM

## 2014-12-28 DIAGNOSIS — K219 Gastro-esophageal reflux disease without esophagitis: Secondary | ICD-10-CM | POA: Diagnosis not present

## 2014-12-28 DIAGNOSIS — R109 Unspecified abdominal pain: Secondary | ICD-10-CM

## 2014-12-28 MED ORDER — DICYCLOMINE HCL 10 MG PO CAPS: 10.0000 mg | ORAL_CAPSULE | Freq: Three times a day (TID) | ORAL | Status: DC | PRN

## 2014-12-28 MED ORDER — PEG 3350-KCL-NA BICARB-NACL 420 G PO SOLR: 4000.0000 mL | ORAL | Status: DC

## 2014-12-28 NOTE — Progress Notes (Signed)
Primary Care Physician:  Delphina Cahill, MD Primary Gastroenterologist:  Dr. Oneida Alar  Chief Complaint  Patient presents with  . heme + stool    HPI:   44 year old female presents for office visit evaluation for possible colonoscopy due to heme positive stool card with PCP. Has previously seen in our office in 2012, has a history of irritable bowel syndrome, H. pylori gastritis, and GERD. Last endoscopy done on 01/13/2011. Findings are normal esophagus, patchy erythema without erosion or ulceration in the antrum of the stomach, normal duodenal bulb and second portion of the duodenum. Determine most likely etiology for abdominal pain irritable bowel syndrome with constipation. Recommended continue omeprazole. Pathology showed normal small bowel and reactive gastropathy.  Today she states the appointment with the stool card was with OB/GYN. Was having some stomach discomfort leading up to the visit and thought she could possible have a UTI, but the testing for this was negative. Continues to have abdominal pain which is generalized, is crampy and occasionally sharp. Also states she has occasional LUQ squeezing/knotting pain which is happening more recently in the past 1-2 months. Admits some nausea, denies vomiting. Has a bowel movement every other day to every day. Her stools tend to be consistent with [facility] Scale 5-6. Denies hematochezia and melena. GERD symptoms relatively well controlled on Prilosec. Will have recurrent symptoms if she misses more then 1 dose. Denies unintentional weight loss, fever, chills, changes in bowel pattern or consistency. Has had some recent increasing fatigue. Also admits lightheadedness episodes approximately 2-3 times a week. Denies chest pain, dyspnea, syncope, or near syncope. Denies any other upper or lower GI symptoms.  Past Medical History  Diagnosis Date  . Pulmonary stenosis   . IBS (irritable bowel syndrome)   . S/P endoscopy 05/25/08    Dr Maryclare Bean  small bowel biopsy, reactive gastropathy  . Helicobacter pylori gastritis 2005  . S/P endoscopy 2005    Dr. Rhoderick Moody polyp  . GERD (gastroesophageal reflux disease)   . Fatty liver     Past Surgical History  Procedure Laterality Date  . Tonsillectomy    . Knee surgery      left  . Wisdom tooth extraction    . Cholecystectomy  1997    ERCP with sphincterotomy/stone extraction  . Dilation and curettage of uterus    . Endometrial ablation    . Esophagogastroduodenoscopy  05/25/2008    GEX:BMWUXL small bowel     Current Outpatient Prescriptions  Medication Sig Dispense Refill  . calcium carbonate (OS-CAL) 600 MG TABS Take 600 mg by mouth 2 (two) times daily with a meal.      . cholecalciferol (VITAMIN D) 1000 UNITS tablet Take 1,000 Units by mouth daily.      . Multiple Vitamin (MULTIVITAMIN) capsule Take 1 capsule by mouth daily.      . pantoprazole (PROTONIX) 40 MG tablet   4  . Norethin-Eth Estradiol-Fe (GENERESS FE PO) Take by mouth.     No current facility-administered medications for this visit.    Allergies as of 12/28/2014 - Review Complete 12/28/2014  Allergen Reaction Noted  . Levbid [hyoscyamine sulfate] Rash 05/21/2011    Family History  Problem Relation Age of Onset  . Celiac disease Father     Neill Loft  . Leukemia Father   . Colon polyps Mother     History   Social History  . Marital Status: Married    Spouse Name: N/A  . Number of Children: 2  . Years  of Education: N/A   Occupational History  . 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 Smoking status: Never Smoker   . Smokeless tobacco: Not on file  . Alcohol Use: Yes     Comment: socially one glass of wine per week  . Drug Use: No  . Sexual Activity:    Partners: Male     Comment: No birth control   Other Topics Concern  . Not on file   Social History Narrative    Review of Systems: General: Negative for anorexia, weight loss, fever, chills,  fatigue, weakness. Eyes: Negative for vision changes.  ENT: Negative for hoarseness, difficulty swallowing , nasal congestion. CV: Negative for chest pain, angina, palpitations, dyspnea on exertion, peripheral edema.  Respiratory: Negative for dyspnea at rest, dyspnea on exertion, cough, sputum, wheezing.  GI: See history of present illness. GU:  Negative for dysuria, hematuria, urinary incontinence, urinary frequency, nocturnal urination.  MS: Negative for joint pain, low back pain.  Derm: Negative for rash or itching.  Neuro: Negative for weakness, abnormal sensation, seizure, frequent headaches, memory loss, confusion.  Psych: Negative for anxiety, depression, suicidal ideation, hallucinations.  Endo: Negative for unusual weight change.  Heme: Negative for bruising or bleeding. Allergy: Negative for rash or hives.    Physical Exam: BP 123/82 mmHg  Pulse 73  Temp(Src) 97 F (36.1 C)  Ht 5\' 4"  (1.626 m)  Wt 200 lb (90.719 kg)  BMI 34.31 kg/m2  LMP 12/28/2014 General:   Alert and oriented. Pleasant and cooperative. Well-nourished and well-developed.  Head:  Normocephalic and atraumatic. Eyes:  Without icterus, sclera clear and conjunctiva pink.  Ears:  Normal auditory acuity. Nose:  No deformity, discharge,  or lesions. Mouth:  No deformity or lesions, oral mucosa pink.  Neck:  Supple, without mass or thyromegaly. Lungs:  Clear to auscultation bilaterally. No wheezes, rales, or rhonchi. No distress.  Heart:  S1, S2 present without murmurs appreciated.  Abdomen:  +BS, soft, non-tender and non-distended. No HSM noted. No guarding or rebound. No masses appreciated.  Rectal:  Deferred  Msk:  Symmetrical without gross deformities. Normal posture. Pulses:  Normal pulses noted. Extremities:  Without clubbing or edema. Neurologic:  Alert and  oriented x4;  grossly normal neurologically. Skin:  Intact without significant lesions or rashes. Cervical Nodes:  No significant cervical  adenopathy. Psych:  Alert and cooperative. Normal mood and affect.     12/28/2014 11:11 AM

## 2014-12-28 NOTE — Patient Instructions (Signed)
1. We will schedule your procedure (colonoscopy with possible endoscopy) for you. 2. I send in a prescription for Bentyl to help you with her symptoms in the meantime. 3. Further recommendations to be based on results your procedure. 4. Please have your labs drawn next few days.

## 2014-12-31 ENCOUNTER — Other Ambulatory Visit: Payer: Self-pay

## 2015-01-03 LAB — COMPREHENSIVE METABOLIC PANEL
ALBUMIN: 4.1 g/dL (ref 3.5–5.2)
ALT: 12 U/L (ref 0–35)
AST: 18 U/L (ref 0–37)
Alkaline Phosphatase: 47 U/L (ref 39–117)
BUN: 13 mg/dL (ref 6–23)
CALCIUM: 8.7 mg/dL (ref 8.4–10.5)
CHLORIDE: 105 meq/L (ref 96–112)
CO2: 32 mEq/L (ref 19–32)
CREATININE: 0.91 mg/dL (ref 0.50–1.10)
GLUCOSE: 103 mg/dL — AB (ref 70–99)
POTASSIUM: 4.1 meq/L (ref 3.5–5.3)
Sodium: 140 mEq/L (ref 135–145)
Total Bilirubin: 0.4 mg/dL (ref 0.2–1.2)
Total Protein: 6.4 g/dL (ref 6.0–8.3)

## 2015-01-03 LAB — CBC WITH DIFFERENTIAL/PLATELET
BASOS ABS: 0.1 10*3/uL (ref 0.0–0.1)
Basophils Relative: 1 % (ref 0–1)
EOS PCT: 2 % (ref 0–5)
Eosinophils Absolute: 0.1 10*3/uL (ref 0.0–0.7)
HCT: 38.7 % (ref 36.0–46.0)
HEMOGLOBIN: 12.9 g/dL (ref 12.0–15.0)
LYMPHS PCT: 25 % (ref 12–46)
Lymphs Abs: 1.6 10*3/uL (ref 0.7–4.0)
MCH: 28.7 pg (ref 26.0–34.0)
MCHC: 33.3 g/dL (ref 30.0–36.0)
MCV: 86 fL (ref 78.0–100.0)
MPV: 10.1 fL (ref 8.6–12.4)
Monocytes Absolute: 0.4 10*3/uL (ref 0.1–1.0)
Monocytes Relative: 7 % (ref 3–12)
NEUTROS ABS: 4.2 10*3/uL (ref 1.7–7.7)
NEUTROS PCT: 65 % (ref 43–77)
PLATELETS: 257 10*3/uL (ref 150–400)
RBC: 4.5 MIL/uL (ref 3.87–5.11)
RDW: 13.9 % (ref 11.5–15.5)
WBC: 6.4 10*3/uL (ref 4.0–10.5)

## 2015-01-03 LAB — LIPASE: Lipase: 14 U/L (ref 0–75)

## 2015-01-04 NOTE — Assessment & Plan Note (Signed)
44 year old female with heme-positive stool on stool card PCP was referred to our office for possible colonoscopy. Has a history of irritable bowel syndrome, H. pylori gastritis, and GERD. GERD symptoms generally well controlled per the patient's opinion. However is having left upper quadrant pain as well as generalized abdominal pain. Given her history and presentation we will proceed with a colonoscopy +/- EGD. Galloping could be resulting recurrence of H. pylori gastritis, esophagitis, general gastritis, peptic ulcer disease. This would also explain heme positive stool if it is noted.  Proceed with colonoscopy +/- EGD with Dr. Oneida Alar in the near future. The risks, benefits, and alternatives have been discussed in detail with the patient. They state understanding and desire to proceed.   Patient is not on any anticoagulants, diabetes medications, long-term anxiety or pain medications. Admits social alcohol use about one glass of wine per week, denies drug use.

## 2015-01-04 NOTE — Assessment & Plan Note (Signed)
Patient with GERD history that the patient feels generally well controlled on her current PPI regimen. However is having left upper quadrant abdominal pain and heme positive stool which could be potential upper GI bleed. Order CBC, CMP, and lipase today. We'll also plan to do a colonoscopy +/- EGD. No additional red flag/warning signs or symptoms at this point.  Proceed with colonoscopy +/- EGD with Dr. Oneida Alar in the near future. The risks, benefits, and alternatives have been discussed in detail with the patient. They state understanding and desire to proceed.   Patient is not on any anticoagulants, diabetes medications, long-term anxiety or pain medications. Admits social alcohol use about one glass of wine per week, denies drug use.

## 2015-01-04 NOTE — Assessment & Plan Note (Signed)
Patient with generalized and left upper quadrant pain which is progressing in severity and frequency over the past 1-2 months. Testing for UTI was negative. Occasional nausea but denies vomiting. Has bowel movement every other day tends to be on the looser side [place] 5-6. GERD symptoms generally well controlled. At this point we will order a CBC, CMP and lipase. We'll send in prescription for Bentyl 10 mg 3 times a day as needed for symptomatically control. We'll proceed with colonoscopy for heme positive stool (see heme positive stool assessment and plan note for more details).

## 2015-01-09 NOTE — Progress Notes (Signed)
CC'ED TO PCP 

## 2015-01-18 ENCOUNTER — Encounter (HOSPITAL_COMMUNITY)
Admission: RE | Disposition: A | Discharge status: Home / Self Care | Payer: Self-pay | Source: Ambulatory Visit | Attending: Gastroenterology

## 2015-01-18 ENCOUNTER — Ambulatory Visit (HOSPITAL_COMMUNITY)
Admission: RE | Admit: 2015-01-18 | Discharge: 2015-01-18 | Disposition: A | Discharge status: Home / Self Care | Payer: BLUE CROSS/BLUE SHIELD | Source: Ambulatory Visit | Attending: Gastroenterology | Admitting: Gastroenterology

## 2015-01-18 DIAGNOSIS — K317 Polyp of stomach and duodenum: Secondary | ICD-10-CM | POA: Diagnosis not present

## 2015-01-18 DIAGNOSIS — Z79899 Other long term (current) drug therapy: Secondary | ICD-10-CM | POA: Insufficient documentation

## 2015-01-18 DIAGNOSIS — Z9049 Acquired absence of other specified parts of digestive tract: Secondary | ICD-10-CM | POA: Diagnosis not present

## 2015-01-18 DIAGNOSIS — K2950 Unspecified chronic gastritis without bleeding: Secondary | ICD-10-CM | POA: Insufficient documentation

## 2015-01-18 DIAGNOSIS — K648 Other hemorrhoids: Secondary | ICD-10-CM | POA: Diagnosis not present

## 2015-01-18 DIAGNOSIS — K589 Irritable bowel syndrome without diarrhea: Secondary | ICD-10-CM | POA: Insufficient documentation

## 2015-01-18 DIAGNOSIS — R1013 Epigastric pain: Secondary | ICD-10-CM | POA: Diagnosis present

## 2015-01-18 DIAGNOSIS — R195 Other fecal abnormalities: Secondary | ICD-10-CM | POA: Diagnosis not present

## 2015-01-18 DIAGNOSIS — K219 Gastro-esophageal reflux disease without esophagitis: Secondary | ICD-10-CM | POA: Insufficient documentation

## 2015-01-18 DIAGNOSIS — K621 Rectal polyp: Secondary | ICD-10-CM | POA: Insufficient documentation

## 2015-01-18 DIAGNOSIS — D124 Benign neoplasm of descending colon: Secondary | ICD-10-CM | POA: Diagnosis not present

## 2015-01-18 DIAGNOSIS — R109 Unspecified abdominal pain: Secondary | ICD-10-CM | POA: Insufficient documentation

## 2015-01-18 HISTORY — PX: COLONOSCOPY: SHX5424

## 2015-01-18 HISTORY — PX: ESOPHAGOGASTRODUODENOSCOPY: SHX5428

## 2015-01-18 SURGERY — COLONOSCOPY
Anesthesia: Moderate Sedation

## 2015-01-18 MED ORDER — MEPERIDINE HCL 100 MG/ML IJ SOLN
INTRAMUSCULAR | Status: DC | PRN
  Administered 2015-01-18 (×4): 25 mg via INTRAVENOUS

## 2015-01-18 MED ORDER — MIDAZOLAM HCL 5 MG/5ML IJ SOLN
INTRAMUSCULAR | Status: DC | PRN
  Administered 2015-01-18: 1 mg via INTRAVENOUS
  Administered 2015-01-18: 2 mg via INTRAVENOUS
  Administered 2015-01-18: 1 mg via INTRAVENOUS
  Administered 2015-01-18: 2 mg via INTRAVENOUS
  Administered 2015-01-18: 1 mg via INTRAVENOUS

## 2015-01-18 MED ORDER — SODIUM CHLORIDE 0.9 % IV SOLN
INTRAVENOUS | Status: DC
  Administered 2015-01-18: 1000 mL via INTRAVENOUS

## 2015-01-18 MED ORDER — STERILE WATER FOR IRRIGATION IR SOLN
Status: DC | PRN
  Administered 2015-01-18: 14:00:00

## 2015-01-18 MED ORDER — LIDOCAINE VISCOUS 2 % MT SOLN
OROMUCOSAL | Status: AC
  Filled 2015-01-18: qty 15

## 2015-01-18 MED ORDER — MIDAZOLAM HCL 5 MG/5ML IJ SOLN
INTRAMUSCULAR | Status: AC
  Filled 2015-01-18: qty 10

## 2015-01-18 MED ORDER — MEPERIDINE HCL 100 MG/ML IJ SOLN
INTRAMUSCULAR | Status: AC
  Filled 2015-01-18: qty 2

## 2015-01-18 MED ORDER — LIDOCAINE VISCOUS 2 % MT SOLN
OROMUCOSAL | Status: DC | PRN
  Administered 2015-01-18: 1 via OROMUCOSAL

## 2015-01-18 NOTE — Discharge Instructions (Signed)
You have internal hemorrhoids, and HAD 2 small polyps removed. You have mild gastritis AND GASTRIC POLYPS.I biopsied your stomach AND SMALL BOWEL.   CONTINUE YOUR WEIGHT LOSS EFFORTS.  FOLLOW A HIGH FIBER/LOW FAT DIET. AVOID ITEMS THAT CAUSE BLOATING. SEE INFO BELOW.  CONTINUE PROTONIX. TAKE 30 MINUTES PRIOR TO BREAKFAST.  TAKE DICYCLOMINE 10 MG TABLETS ONE OR TWO 30 MINUTES PRIOR MEALS UP TO THREE TIMES A DAY AS NEEDED FOR ABDOMINAL PAIN/DIARRHEA. IT MAY CAUSE DROWSINESS, DRY EYES/MOUTH, BLURRY VISION, OR DIFFICULTY URINATING.  YOUR BIOPSY RESULTS WILL BE AVAILABLE IN MY CHART AFTER MAY 24 AND MY OFFICE WILL CONTACT YOU IN 10-14 DAYS WITH YOUR RESULTS. ITIS.  Next colonoscopy in 5-10 years WITH OSMOPREP.   ENDOSCOPY Care After Read the instructions outlined below and refer to this sheet in the next week. These discharge instructions provide you with general information on caring for yourself after you leave the hospital. While your treatment has been planned according to the most current medical practices available, unavoidable complications occasionally occur. If you have any problems or questions after discharge, call DR. Flavius Repsher, [phone].  ACTIVITY  You may resume your regular activity, but move at a slower pace for the next 24 hours.   Take frequent rest periods for the next 24 hours.   Walking will help get rid of the air and reduce the bloated feeling in your belly (abdomen).   No driving for 24 hours (because of the medicine (anesthesia) used during the test).   You may shower.   Do not sign any important legal documents or operate any machinery for 24 hours (because of the anesthesia used during the test).    NUTRITION  Drink plenty of fluids.   You may resume your normal diet as instructed by your doctor.   Begin with a light meal and progress to your normal diet. Heavy or fried foods are harder to digest and may make you feel sick to your stomach (nauseated).     Avoid alcoholic beverages for 24 hours or as instructed.    MEDICATIONS  You may resume your normal medications.   WHAT YOU CAN EXPECT TODAY  Some feelings of bloating in the abdomen.   Passage of more gas than usual.   Spotting of blood in your stool or on the toilet paper  .  IF YOU HAD POLYPS REMOVED DURING THE ENDOSCOPY:  Eat a soft diet IF YOU HAVE NAUSEA, BLOATING, ABDOMINAL PAIN, OR VOMITING.    FINDING OUT THE RESULTS OF YOUR TEST Not all test results are available during your visit. DR. Oneida Alar WILL CALL YOU WITHIN 14 DAYS OF YOUR PROCEDUE WITH YOUR RESULTS. Do not assume everything is normal if you have not heard from DR. Chidi Shirer, CALL HER OFFICE AT [phone].  SEEK IMMEDIATE MEDICAL ATTENTION AND CALL THE OFFICE: [phone] IF:  You have more than a spotting of blood in your stool.   Your belly is swollen (abdominal distention).   You are nauseated or vomiting.   You have a temperature over 101F.   You have abdominal pain or discomfort that is severe or gets worse throughout the day.  Polyps, Colon  A polyp is extra tissue that grows inside your body. Colon polyps grow in the large intestine. The large intestine, also called the colon, is part of your digestive system. It is a long, hollow tube at the end of your digestive tract where your body makes and stores stool. Most polyps are not dangerous. They are benign.  This means they are not cancerous. But over time, some types of polyps can turn into cancer. Polyps that are smaller than a pea are usually not harmful. But larger polyps could someday become or may already be cancerous. To be safe, doctors remove all polyps and test them.   PREVENTION There is not one sure way to prevent polyps. You might be able to lower your risk of getting them if you:  Eat more fruits and vegetables and less fatty food.   Do not smoke.   Avoid alcohol.   Exercise every day.   Lose weight if you are overweight.    Eating more calcium and folate can also lower your risk of getting polyps. Some foods that are rich in calcium are milk, cheese, and broccoli. Some foods that are rich in folate are chickpeas, kidney beans, and spinach.    Gastritis  Gastritis is an inflammation (the body's way of reacting to injury and/or infection) of the stomach. It is often caused by viral or bacterial (germ) infections. It can also be caused BY ASPIRIN, BC/GOODY POWDER'S, (IBUPROFEN) MOTRIN, OR ALEVE (NAPROXEN), chemicals (including alcohol), SPICY FOODS, and medications. This illness may be associated with generalized malaise (feeling tired, not well), UPPER ABDOMINAL STOMACH cramps, and fever. One common bacterial cause of gastritis is an organism known as H. Pylori. This can be treated with antibiotics.    High-Fiber Diet A high-fiber diet changes your normal diet to include more whole grains, legumes, fruits, and vegetables. Changes in the diet involve replacing refined carbohydrates with unrefined foods. The calorie level of the diet is essentially unchanged. The Dietary Reference Intake (recommended amount) for adult males is 38 grams per day. For adult females, it is 25 grams per day. Pregnant and lactating women should consume 28 grams of fiber per day. Fiber is the intact part of a plant that is not broken down during digestion. Functional fiber is fiber that has been isolated from the plant to provide a beneficial effect in the body. PURPOSE  Increase stool bulk.   Ease and regulate bowel movements.   Lower cholesterol.  INDICATIONS THAT YOU NEED MORE FIBER  Constipation and hemorrhoids.   Uncomplicated diverticulosis (intestine condition) and irritable bowel syndrome.   Weight management.   As a protective measure against hardening of the arteries (atherosclerosis), diabetes, and cancer.   GUIDELINES FOR INCREASING FIBER IN THE DIET  Start adding fiber to the diet slowly. A gradual increase of about  5 more grams (2 slices of whole-wheat bread, 2 servings of most fruits or vegetables, or 1 bowl of high-fiber cereal) per day is best. Too rapid an increase in fiber may result in constipation, flatulence, and bloating.   Drink enough water and fluids to keep your urine clear or pale yellow. Water, juice, or caffeine-free drinks are recommended. Not drinking enough fluid may cause constipation.   Eat a variety of high-fiber foods rather than one type of fiber.   Try to increase your intake of fiber through using high-fiber foods rather than fiber pills or supplements that contain small amounts of fiber.   The goal is to change the types of food eaten. Do not supplement your present diet with high-fiber foods, but replace foods in your present diet.  INCLUDE A VARIETY OF FIBER SOURCES  Replace refined and processed grains with whole grains, canned fruits with fresh fruits, and incorporate other fiber sources. White rice, white breads, and most bakery goods contain little or no fiber.  Brown whole-grain rice, buckwheat oats, and many fruits and vegetables are all good sources of fiber. These include: broccoli, Brussels sprouts, cabbage, cauliflower, beets, sweet potatoes, white potatoes (skin on), carrots, tomatoes, eggplant, squash, berries, fresh fruits, and dried fruits.   Cereals appear to be the richest source of fiber. Cereal fiber is found in whole grains and bran. Bran is the fiber-rich outer coat of cereal grain, which is largely removed in refining. In whole-grain cereals, the bran remains. In breakfast cereals, the largest amount of fiber is found in those with "bran" in their names. The fiber content is sometimes indicated on the label.   You may need to include additional fruits and vegetables each day.   In baking, for 1 cup white flour, you may use the following substitutions:   1 cup whole-wheat flour minus 2 tablespoons.   1/2 cup white flour plus 1/2 cup whole-wheat flour.    Low-Fat Diet BREADS, CEREALS, PASTA, RICE, DRIED PEAS, AND BEANS These products are high in carbohydrates and most are low in fat. Therefore, they can be increased in the diet as substitutes for fatty foods. They too, however, contain calories and should not be eaten in excess. Cereals can be eaten for snacks as well as for breakfast.  Include foods that contain fiber (fruits, vegetables, whole grains, and legumes). Research shows that fiber may lower blood cholesterol levels, especially the water-soluble fiber found in fruits, vegetables, oat products, and legumes. FRUITS AND VEGETABLES It is good to eat fruits and vegetables. Besides being sources of fiber, both are rich in vitamins and some minerals. They help you get the daily allowances of these nutrients. Fruits and vegetables can be used for snacks and desserts. MEATS Limit lean meat, chicken, [place], and fish to no more than 6 ounces per day. Beef, Pork, and Lamb Use lean cuts of beef, pork, and lamb. Lean cuts include:  Extra-lean ground beef.  Arm roast.  Sirloin tip.  Center-cut ham.  Round steak.  Loin chops.  Rump roast.  Tenderloin.  Trim all fat off the outside of meats before cooking. It is not necessary to severely decrease the intake of red meat, but lean choices should be made. Lean meat is rich in protein and contains a highly absorbable form of iron. Premenopausal women, in particular, should avoid reducing lean red meat because this could increase the risk for low red blood cells (iron-deficiency anemia). The organ meats, such as liver, sweetbreads, kidneys, and brain are very rich in cholesterol. They should be limited. Chicken and [place] These are good sources of protein. The fat of poultry can be reduced by removing the skin and underlying fat layers before cooking. Chicken and [place] can be substituted for lean red meat in the diet. Poultry should not be fried or covered with high-fat sauces. Fish and  Shellfish Fish is a good source of protein. Shellfish contain cholesterol, but they usually are low in saturated fatty acids. The preparation of fish is important. Like chicken and [place], they should not be fried or covered with high-fat sauces. EGGS Egg whites contain no fat or cholesterol. They can be eaten often. Try 1 to 2 egg whites instead of whole eggs in recipes or use egg substitutes that do not contain yolk. MILK AND DAIRY PRODUCTS Use skim or 1% milk instead of 2% or whole milk. Decrease whole milk, natural, and processed cheeses. Use nonfat or low-fat (2%) cottage cheese or low-fat cheeses made from vegetable oils. Choose nonfat or low-fat (1  to 2%) yogurt. Experiment with evaporated skim milk in recipes that call for heavy cream. Substitute low-fat yogurt or low-fat cottage cheese for sour cream in dips and salad dressings. Have at least 2 servings of low-fat dairy products, such as 2 glasses of skim (or 1%) milk each day to help get your daily calcium intake.  FATS AND OILS Reduce the total intake of fats, especially saturated fat. Butterfat, lard, and beef fats are high in saturated fat and cholesterol. These should be avoided as much as possible. Vegetable fats do not contain cholesterol, but certain vegetable fats, such as coconut oil, palm oil, and palm kernel oil are very high in saturated fats. These should be limited. These fats are often used in bakery goods, processed foods, popcorn, oils, and nondairy creamers. Vegetable shortenings and some peanut butters contain hydrogenated oils, which are also saturated fats. Read the labels on these foods and check for saturated vegetable oils. Unsaturated vegetable oils and fats do not raise blood cholesterol. However, they should be limited because they are fats and are high in calories. Total fat should still be limited to 30% of your daily caloric intake. Desirable liquid vegetable oils are corn oil, cottonseed oil, olive oil, canola oil,  safflower oil, soybean oil, and sunflower oil. Peanut oil is not as good, but small amounts are acceptable. Buy a heart-healthy tub margarine that has no partially hydrogenated oils in the ingredients. Mayonnaise and salad dressings often are made from unsaturated fats, but they should also be limited because of their high calorie and fat content. Seeds, nuts, peanut butter, olives, and avocados are high in fat, but the fat is mainly the unsaturated type. These foods should be limited mainly to avoid excess calories and fat. OTHER EATING TIPS Snacks  Most sweets should be limited as snacks. They tend to be rich in calories and fats, and their caloric content outweighs their nutritional value. Some good choices in snacks are graham crackers, melba toast, soda crackers, bagels (no egg), English muffins, fruits, and vegetables. These snacks are preferable to snack crackers, [place] fries, and chips. Popcorn should be air-popped or cooked in small amounts of liquid vegetable oil. Desserts Eat fruit, low-fat yogurt, and fruit ices. AVOID pastries, cake, and cookies. Sherbet, angel food cake, gelatin dessert, frozen low-fat yogurt, or other frozen products that do not contain saturated fat (pure fruit juice bars, frozen ice pops) are also acceptable.  COOKING METHODS Choose those methods that use little or no fat. They include: Poaching.  Braising.  Steaming.  Grilling.  Baking.  Stir-frying.  Broiling.  Microwaving.  Foods can be cooked in a nonstick pan without added fat, or use a nonfat cooking spray in regular cookware. Limit fried foods and avoid frying in saturated fat. Add moisture to lean meats by using water, broth, cooking wines, and other nonfat or low-fat sauces along with the cooking methods mentioned above. Soups and stews should be chilled after cooking. The fat that forms on top after a few hours in the refrigerator should be skimmed off. When preparing meals, avoid using excess salt. Salt  can contribute to raising blood pressure in some people. EATING AWAY FROM HOME Order entres, potatoes, and vegetables without sauces or butter. When meat exceeds the size of a deck of cards (3 to 4 ounces), the rest can be taken home for another meal. Choose vegetable or fruit salads and ask for low-calorie salad dressings to be served on the side. Use dressings sparingly. Limit high-fat toppings, such  as bacon, crumbled eggs, cheese, sunflower seeds, and olives. Ask for heart-healthy tub margarine instead of butter.   Hemorrhoids Hemorrhoids are dilated (enlarged) veins around the rectum. Sometimes clots will form in the veins. This makes them swollen and painful. These are called thrombosed hemorrhoids. Causes of hemorrhoids include:  Constipation.   Straining to have a bowel movement.   HEAVY LIFTING HOME CARE INSTRUCTIONS  Eat a well balanced diet and drink 6 to 8 glasses of water every day to avoid constipation. You may also use a bulk laxative.   Avoid straining to have bowel movements.   Keep anal area dry and clean.   Do not use a donut shaped pillow or sit on the toilet for long periods. This increases blood pooling and pain.   Move your bowels when your body has the urge; this will require less straining and will decrease pain and pressure.

## 2015-01-18 NOTE — Op Note (Signed)
[facility] [address] [place], [address]   COLONOSCOPY PROCEDURE REPORT  PATIENT: Lisa, Garner  MR#: [id number] BIRTHDATE: [birth year] , 43  yrs. old GENDER: female ENDOSCOPIST: Danie Binder, MD REFERRED ZY:SAYT Hall, M.D. PROCEDURE DATE:  01-22-15 PROCEDURE:   Colonoscopy with biopsy INDICATIONS:heme-positive stool. MEDICATIONS: Demerol 75 mg IV and Versed 5 mg IV  DESCRIPTION OF PROCEDURE:    Physical exam was performed.  Informed consent was obtained from the patient after explaining the benefits, risks, and alternatives to procedure.  The patient was connected to monitor and placed in left lateral position. Continuous oxygen was provided by nasal cannula and IV medicine administered through an indwelling cannula.  After administration of sedation and rectal exam, the patients rectum was intubated and the EC-[id number] ([id number])  colonoscope was advanced under direct visualization to the ileum.  The scope was removed slowly by carefully examining the color, texture, anatomy, and integrity mucosa on the way out.  The patient was recovered in endoscopy and discharged home in satisfactory condition.     COLON FINDINGS: The examined terminal ileum appeared to be normal. , Two sessile polyps ranging from 2 to 47mm in size were found in the rectum and descending colon.  A polypectomy was performed with cold forceps.  , and Small internal hemorrhoids were found.  PREP QUALITY: good.  CECAL W/D TIME: 11       minutes COMPLICATIONS: None  ENDOSCOPIC IMPRESSION: 1.   The examined terminal ileum appeared to be normal 2.   Two COLON polyps REMOVED 3.   HEME POSITIVE STOOLS DUE TO Small internal hemorrhoids  RECOMMENDATIONS: CONTINUE YOUR WEIGHT LOSS EFFORTS. FOLLOW A HIGH FIBER/LOW FAT DIET. PROTONIX 30 MINUTES PRIOR TO BREAKFAST. DICYCLOMINE 10 MG 1-2 30 MINUTES PRIOR MEALS TID. AWAIT BIOPSY RESULTS. Next colonoscopy in 5-10 years WITH  OSMOPREP.    _______________________________ eSignedDanie Binder, MD 01-22-2015 5:08 PM   CPT CODES: ICD CODES:  The ICD and CPT codes recommended by this software are interpretations from the data that the clinical staff has captured with the software.  The verification of the translation of this report to the ICD and CPT codes and modifiers is the sole responsibility of the health care institution and practicing physician where this report was generated.  [place]. will not be held responsible for the validity of the ICD and CPT codes included on this report.  AMA assumes no liability for data contained or not contained herein. CPT is a Designer, television/film set of the [employer].

## 2015-01-18 NOTE — H&P (Signed)
Primary Care Physician:  Delphina Cahill, MD Primary Gastroenterologist:  Dr. Oneida Alar  Pre-Procedure History & Physical: HPI:  Lisa Garner is a 44 y.o. female here for HEME POS STOOLS/DYSPEPSIA.  Past Medical History  Diagnosis Date  . Pulmonary stenosis   . IBS (irritable bowel syndrome)   . S/P endoscopy 05/25/08    Dr Maryclare Bean small bowel biopsy, reactive gastropathy  . Helicobacter pylori gastritis 2005  . S/P endoscopy 2005    Dr. Rhoderick Moody polyp  . GERD (gastroesophageal reflux disease)   . Fatty liver     Past Surgical History  Procedure Laterality Date  . Tonsillectomy    . Knee surgery      left  . Wisdom tooth extraction    . Cholecystectomy  1997    ERCP with sphincterotomy/stone extraction  . Dilation and curettage of uterus    . Endometrial ablation    . Esophagogastroduodenoscopy  05/25/2008    EGB:TDVVOH small bowel     Prior to Admission medications   Medication Sig Start Date End Date Taking? Authorizing Provider  calcium carbonate (OS-CAL) 600 MG TABS Take 600 mg by mouth 2 (two) times daily with a meal.     Yes Historical Provider, MD  cholecalciferol (VITAMIN D) 1000 UNITS tablet Take 1,000 Units by mouth daily.     Yes Historical Provider, MD  dicyclomine (BENTYL) 10 MG capsule Take 1 capsule (10 mg total) by mouth 3 (three) times daily as needed for spasms. 12/28/14  Yes Carlis Stable, NP  magnesium gluconate (MAGONATE) 500 MG tablet Take 500 mg by mouth daily.   Yes Historical Provider, MD  Multiple Vitamin (MULTIVITAMIN) capsule Take 1 capsule by mouth daily.     Yes Historical Provider, MD  Norethin-Eth Estradiol-Fe (GENERESS FE PO) Take by mouth.   Yes Historical Provider, MD  pantoprazole (PROTONIX) 40 MG tablet  12/10/14  Yes Historical Provider, MD  polyethylene glycol-electrolytes (TRILYTE) 420 G solution Take 4,000 mLs by mouth as directed. 12/28/14  Yes Danie Binder, MD    Allergies as of 12/28/2014 - Review Complete  12/28/2014  Allergen Reaction Noted  . Levbid [hyoscyamine sulfate] Rash 05/21/2011    Family History  Problem Relation Age of Onset  . Celiac disease Father     Neill Loft  . Leukemia Father   . Colon polyps Mother   . Colon cancer Neg Hx     History   Social History  . Marital Status: Married    Spouse Name: N/A  . Number of Children: 2  . 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 Smoking status: Never Smoker   . Smokeless tobacco: Never Used  . Alcohol Use: 0.0 oz/week    0 Standard drinks or equivalent per week     Comment: socially one glass of wine per week  . Drug Use: No  . Sexual Activity:    Partners: Male     Comment: No birth control   Other Topics Concern  . Not on file   Social History Narrative    Review of Systems: See HPI, otherwise negative ROS   Physical Exam: BP 105/72 mmHg  Pulse 80  Temp(Src) 97.4 F (36.3 C) (Oral)  Resp 16  SpO2 96%  LMP 12/28/2014 General:   Alert,  pleasant and cooperative in NAD Head:  Normocephalic and atraumatic. Neck:  Supple; Lungs:  Clear throughout to auscultation.  Heart:  Regular rate and rhythm. Abdomen:  Soft, nontender and nondistended. Normal bowel sounds, without guarding, and without rebound.   Neurologic:  Alert and  oriented x4;  grossly normal neurologically.  Impression/Plan:    HEME POS STOOLS/DYSPEPSIA  PLAN: EGD/TCS TODAY

## 2015-01-18 NOTE — Op Note (Signed)
[facility] [address] [place], [address]   ENDOSCOPY PROCEDURE REPORT  PATIENT: Lisa Garner, Lisa Garner  MR#: [id number] BIRTHDATE: 11/27/1970 , 43  yrs. old GENDER: female  ENDOSCOPIST: Danie Binder, MD REFERRED WI:OXBD Hall, M.D. PROCEDURE DATE: 01/27/15 PROCEDURE:   EGD w/ biopsy  INDICATIONS:dyspepsia. MEDICATIONS: TCS+ Versed 1 mg IV and Demerol 25 mg IV TOPICAL ANESTHETIC:   Viscous Xylocaine ASA CLASS:  DESCRIPTION OF PROCEDURE:     Physical exam was performed.  Informed consent was obtained from the patient after explaining the benefits, risks, and alternatives to the procedure.  The patient was connected to the monitor and placed in the left lateral position.  Continuous oxygen was provided by nasal cannula and IV medicine administered through an indwelling cannula.  After administration of sedation, the patients esophagus was intubated and the [id number] ([id number])  endoscope was advanced under direct visualization to the second portion of the duodenum.  The scope was removed slowly by carefully examining the color, texture, anatomy, and integrity of the mucosa on the way out.  The patient was recovered in endoscopy and discharged home in satisfactory condition.   ESOPHAGUS: The mucosa of the esophagus appeared normal.   STOMACH: Multiple sessile polyps were found in the gastric fundus and cardia.  A polypectomy was performed with a cold forceps.   Mild non-erosive gastritis (inflammation) was found in the gastric antrum.  Multiple biopsies were performed using cold forceps. DUODENUM: The duodenal mucosa showed no abnormalities in the bulb and 2nd part of the duodenum.  Cold forceps biopsies were taken in the bulb and second portion. COMPLICATIONS: There were no immediate complications.  ENDOSCOPIC IMPRESSION: 1.   DYSPEPSIA MOST LIKELY DUE TO IBS-D 2.   Multiple GASTRIC polyps in the gastric fundus and cardia 3.   MILD Non-erosive  gastritis  RECOMMENDATIONS: CONTINUE YOUR WEIGHT LOSS EFFORTS. FOLLOW A HIGH FIBER/LOW FAT DIET. PROTONIX 30 MINUTES PRIOR TO BREAKFAST. DICYCLOMINE 10 MG 1-2 30 MINUTES PRIOR MEALS TID. AWAIT BIOPSY RESULTS. Next colonoscopy in 5-10 years WITH OSMOPREP.  REPEAT EXAM: eSigned:  Danie Binder, MD 27-Jan-2015 5:12 PM     CPT CODES: ICD CODES:  The ICD and CPT codes recommended by this software are interpretations from the data that the clinical staff has captured with the software.  The verification of the translation of this report to the ICD and CPT codes and modifiers is the sole responsibility of the health care institution and practicing physician where this report was generated.  [place]. will not be held responsible for the validity of the ICD and CPT codes included on this report.  AMA assumes no liability for data contained or not contained herein. CPT is a Designer, television/film set of the [employer].

## 2015-01-23 ENCOUNTER — Encounter (HOSPITAL_COMMUNITY): Payer: Self-pay | Admitting: Gastroenterology

## 2015-02-06 ENCOUNTER — Telehealth: Payer: Self-pay | Admitting: Gastroenterology

## 2015-02-06 NOTE — Telephone Encounter (Signed)
Please call pt. She had TWO simple adenomas removed. HER stomach Bx shows gastritis.Her colon and small bowel biopsies are normal. HER SYMPTOMS ARE DUE TO IBS-D.   CONTINUE YOUR WEIGHT LOSS EFFORTS.  FOLLOW A HIGH FIBER/LOW FAT DIET. AVOID ITEMS THAT CAUSE BLOATING.   CONTINUE PROTONIX. TAKE 30 MINUTES PRIOR TO BREAKFAST.  TAKE DICYCLOMINE 10 MG TABLETS ONE OR TWO 30 MINUTES PRIOR MEALS UP TO THREE TIMES A DAY AS NEEDED FOR ABDOMINAL PAIN/DIARRHEA. IT MAY CAUSE DROWSINESS, DRY EYES/MOUTH, BLURRY VISION, OR DIFFICULTY URINATING.  OPV IN AUG 2016 E30 DIARRHEA/DYSPEPSIA.  Next colonoscopy in 5-10 years WITH OSMOPREP.

## 2015-02-06 NOTE — Telephone Encounter (Signed)
PT TO FOLLOW UP IN AUG. (RECALL LIST) REMINDER IN EPIC OF NEXT TCS

## 2015-02-07 NOTE — Telephone Encounter (Signed)
PT is aware of results.  

## 2015-02-07 NOTE — Telephone Encounter (Signed)
L/M to call.

## 2015-03-06 ENCOUNTER — Encounter: Payer: Self-pay | Admitting: Gastroenterology

## 2015-05-20 ENCOUNTER — Encounter: Payer: Self-pay | Admitting: Cardiovascular Disease

## 2016-01-23 DIAGNOSIS — Z1231 Encounter for screening mammogram for malignant neoplasm of breast: Secondary | ICD-10-CM | POA: Diagnosis not present

## 2016-01-23 DIAGNOSIS — N951 Menopausal and female climacteric states: Secondary | ICD-10-CM | POA: Diagnosis not present

## 2016-01-23 DIAGNOSIS — Z124 Encounter for screening for malignant neoplasm of cervix: Secondary | ICD-10-CM | POA: Diagnosis not present

## 2016-01-23 DIAGNOSIS — Z1329 Encounter for screening for other suspected endocrine disorder: Secondary | ICD-10-CM | POA: Diagnosis not present

## 2016-01-23 DIAGNOSIS — Z01419 Encounter for gynecological examination (general) (routine) without abnormal findings: Secondary | ICD-10-CM | POA: Diagnosis not present

## 2016-01-23 DIAGNOSIS — Z6835 Body mass index (BMI) 35.0-35.9, adult: Secondary | ICD-10-CM | POA: Diagnosis not present

## 2016-03-31 DIAGNOSIS — J302 Other seasonal allergic rhinitis: Secondary | ICD-10-CM | POA: Diagnosis not present

## 2016-03-31 DIAGNOSIS — M542 Cervicalgia: Secondary | ICD-10-CM | POA: Diagnosis not present

## 2016-03-31 DIAGNOSIS — M545 Low back pain: Secondary | ICD-10-CM | POA: Diagnosis not present

## 2016-03-31 DIAGNOSIS — H9209 Otalgia, unspecified ear: Secondary | ICD-10-CM | POA: Diagnosis not present

## 2016-03-31 DIAGNOSIS — R51 Headache: Secondary | ICD-10-CM | POA: Diagnosis not present

## 2016-04-02 DIAGNOSIS — M545 Low back pain: Secondary | ICD-10-CM | POA: Diagnosis not present

## 2016-04-02 DIAGNOSIS — M542 Cervicalgia: Secondary | ICD-10-CM | POA: Diagnosis not present

## 2016-04-06 DIAGNOSIS — M542 Cervicalgia: Secondary | ICD-10-CM | POA: Diagnosis not present

## 2016-04-06 DIAGNOSIS — M545 Low back pain: Secondary | ICD-10-CM | POA: Diagnosis not present

## 2016-04-08 DIAGNOSIS — M542 Cervicalgia: Secondary | ICD-10-CM | POA: Diagnosis not present

## 2016-04-08 DIAGNOSIS — M545 Low back pain: Secondary | ICD-10-CM | POA: Diagnosis not present

## 2016-04-14 DIAGNOSIS — N959 Unspecified menopausal and perimenopausal disorder: Secondary | ICD-10-CM | POA: Diagnosis not present

## 2016-04-16 DIAGNOSIS — M545 Low back pain: Secondary | ICD-10-CM | POA: Diagnosis not present

## 2016-04-16 DIAGNOSIS — M546 Pain in thoracic spine: Secondary | ICD-10-CM | POA: Diagnosis not present

## 2016-04-16 DIAGNOSIS — M542 Cervicalgia: Secondary | ICD-10-CM | POA: Diagnosis not present

## 2016-04-23 DIAGNOSIS — M542 Cervicalgia: Secondary | ICD-10-CM | POA: Diagnosis not present

## 2016-04-23 DIAGNOSIS — M546 Pain in thoracic spine: Secondary | ICD-10-CM | POA: Diagnosis not present

## 2016-04-23 DIAGNOSIS — M545 Low back pain: Secondary | ICD-10-CM | POA: Diagnosis not present

## 2016-04-28 DIAGNOSIS — M546 Pain in thoracic spine: Secondary | ICD-10-CM | POA: Diagnosis not present

## 2016-04-28 DIAGNOSIS — M542 Cervicalgia: Secondary | ICD-10-CM | POA: Diagnosis not present

## 2016-04-28 DIAGNOSIS — M545 Low back pain: Secondary | ICD-10-CM | POA: Diagnosis not present

## 2016-05-07 DIAGNOSIS — M546 Pain in thoracic spine: Secondary | ICD-10-CM | POA: Diagnosis not present

## 2016-05-07 DIAGNOSIS — M542 Cervicalgia: Secondary | ICD-10-CM | POA: Diagnosis not present

## 2016-05-07 DIAGNOSIS — M545 Low back pain: Secondary | ICD-10-CM | POA: Diagnosis not present

## 2016-05-21 DIAGNOSIS — M542 Cervicalgia: Secondary | ICD-10-CM | POA: Diagnosis not present

## 2016-05-21 DIAGNOSIS — M545 Low back pain: Secondary | ICD-10-CM | POA: Diagnosis not present

## 2016-05-21 DIAGNOSIS — M546 Pain in thoracic spine: Secondary | ICD-10-CM | POA: Diagnosis not present

## 2016-06-04 DIAGNOSIS — M545 Low back pain: Secondary | ICD-10-CM | POA: Diagnosis not present

## 2016-06-04 DIAGNOSIS — M546 Pain in thoracic spine: Secondary | ICD-10-CM | POA: Diagnosis not present

## 2016-06-04 DIAGNOSIS — M542 Cervicalgia: Secondary | ICD-10-CM | POA: Diagnosis not present

## 2016-06-18 DIAGNOSIS — M546 Pain in thoracic spine: Secondary | ICD-10-CM | POA: Diagnosis not present

## 2016-06-18 DIAGNOSIS — M542 Cervicalgia: Secondary | ICD-10-CM | POA: Diagnosis not present

## 2016-06-18 DIAGNOSIS — M545 Low back pain: Secondary | ICD-10-CM | POA: Diagnosis not present

## 2016-09-24 DIAGNOSIS — M7582 Other shoulder lesions, left shoulder: Secondary | ICD-10-CM | POA: Diagnosis not present

## 2017-01-22 DIAGNOSIS — M25511 Pain in right shoulder: Secondary | ICD-10-CM | POA: Diagnosis not present

## 2017-01-22 DIAGNOSIS — G5601 Carpal tunnel syndrome, right upper limb: Secondary | ICD-10-CM | POA: Diagnosis not present

## 2017-01-22 DIAGNOSIS — M5030 Other cervical disc degeneration, unspecified cervical region: Secondary | ICD-10-CM | POA: Diagnosis not present

## 2017-02-04 DIAGNOSIS — M67911 Unspecified disorder of synovium and tendon, right shoulder: Secondary | ICD-10-CM | POA: Diagnosis not present

## 2017-03-02 DIAGNOSIS — J029 Acute pharyngitis, unspecified: Secondary | ICD-10-CM | POA: Diagnosis not present

## 2017-03-10 DIAGNOSIS — R070 Pain in throat: Secondary | ICD-10-CM | POA: Diagnosis not present

## 2017-03-10 DIAGNOSIS — J060 Acute laryngopharyngitis: Secondary | ICD-10-CM | POA: Diagnosis not present

## 2017-03-12 DIAGNOSIS — Z7289 Other problems related to lifestyle: Secondary | ICD-10-CM | POA: Diagnosis not present

## 2017-03-12 DIAGNOSIS — R490 Dysphonia: Secondary | ICD-10-CM | POA: Insufficient documentation

## 2017-03-18 ENCOUNTER — Ambulatory Visit: Payer: BLUE CROSS/BLUE SHIELD | Attending: Otolaryngology

## 2017-03-18 DIAGNOSIS — R490 Dysphonia: Secondary | ICD-10-CM

## 2017-03-18 NOTE — Patient Instructions (Addendum)
   Practice for 5 minutes, at least 5 times each day with h, m, n,ng, ooooo, oh sounds in words like we did today -- FEEL THE RELAXATION OF YOUR NORMAL VOICE  Call [facility] and get appointments up there. [phone]

## 2017-03-18 NOTE — Therapy (Signed)
[facility] [address] [place], [place], [address] Phone: [phone]   Fax:  [phone]  Speech Language Pathology Evaluation  Patient Details  Name: Lisa Garner MRN: [id number] Date of Birth: June 25, 1971 Referring Provider: Izora Gala, MD  Encounter Date: 03/18/2017      End of Session - 03/18/17 1715    Visit Number 1   Number of Visits 7   Date for SLP Re-Evaluation 05/21/17   SLP Start Time 0263   SLP Stop Time  1403   SLP Time Calculation (min) 45 min   Activity Tolerance Patient tolerated treatment well      Past Medical History:  Diagnosis Date  . Cardiac murmur   . Claudication    Lower arterial duplex scan 01/05/08 - Normal  . Congenital pulmonary stenosis    mild  . DOE (dyspnea on exertion)   . Fatty liver   . GERD (gastroesophageal reflux disease)   . Helicobacter pylori gastritis 2005  . IBS (irritable bowel syndrome)   . Pulmonary hypertension    TTE 10/23/11 - LV size normal. LV EF 50-55%  . Pulmonary stenosis   . S/P endoscopy 05/25/08   Dr Maryclare Bean small bowel biopsy, reactive gastropathy  . S/P endoscopy 2005   Dr. Rhoderick Moody polyp  . Skin disorder   . SOB (shortness of breath)    mild    Past Surgical History:  Procedure Laterality Date  . CHOLECYSTECTOMY  1997   ERCP with sphincterotomy/stone extraction  . COLONOSCOPY N/A 01/18/2015   Procedure: COLONOSCOPY;  Surgeon: Danie Binder, MD;  Location: [facility];  Service: Endoscopy;  Laterality: N/A;  1245 - moved to 1:30 - office to notify  . DILATION AND CURETTAGE OF UTERUS    . ENDOMETRIAL ABLATION    . ESOPHAGOGASTRODUODENOSCOPY  05/25/2008   ZCH:YIFOYD small bowel   . ESOPHAGOGASTRODUODENOSCOPY N/A 01/18/2015   Procedure: ESOPHAGOGASTRODUODENOSCOPY (EGD);  Surgeon: Danie Binder, MD;  Location: [facility];  Service: Endoscopy;  Laterality: N/A;  . KNEE SURGERY Left 02/1989  . TONSILLECTOMY    .  WISDOM TOOTH EXTRACTION  11/1989    There were no vitals filed for this visit.          SLP Evaluation [facility] - 03/18/17 1317      SLP Visit Information   SLP Received On 03/18/17   Referring Provider Izora Gala, MD   Onset Date 02-28-17   Medical Diagnosis Functional Dysphonia     General Information   HPI Pt with mostly breathy quality voice with mild hoarseness. Complains of globus, and dypsnea when speaking for >5 minutes     Prior Functional Status   Cognitive/Linguistic Baseline Within functional limits     Cognition   Overall Cognitive Status Within Functional Limits for tasks assessed     Auditory Comprehension   Overall Auditory Comprehension Appears within functional limits for tasks assessed     Verbal Expression   Overall Verbal Expression Appears within functional limits for tasks assessed     Oral Motor/Sensory Function   Overall Oral Motor/Sensory Function Appears within functional limits for tasks assessed     Motor Speech   Respiration Impaired   Level of Impairment Phrase   Phonation Breathy;Hoarse   Intelligibility Intelligible   Phonation Impaired   Vocal Abuses --  strained-like voice   Volume Soft;Appropriate   Pitch Aphonic  but appropriate, following vocal relaxation/shaping today     Standardized Assessments   Standardized Assessments  Other Assessment   Other Assessment s/z ratio= 1.2 (WNL); sustained /a/ = average 17 seconds (WNL)                         SLP Education - 03/18/17 1402    Education provided Yes   Education Details vocal relaxation for incr'd awareness of WNL voicing   Person(s) Educated Patient;Child(ren)   Methods Explanation;Demonstration;Verbal cues   Comprehension Verbalized understanding;Returned demonstration          SLP Short Term Goals - 03/18/17 1724      SLP SHORT TERM GOAL #1   Title pt demo abdominal breathing in 18/20 sentence responses   Time 4   Period Weeks  or sessions,  for all STGs   Status New     SLP SHORT TERM GOAL #2   Title pt will demo WNL voicing in 18/20 sentence responses   Time 4   Period Weeks   Status New     SLP SHORT TERM GOAL #3   Title pt will ID strained vs. WNL voicing correctly during sessions when asked 95% of the time   Time 4   Status New          SLP Long Term Goals - 03/18/17 1726      SLP LONG TERM GOAL #1   Title pt will produce WNL speech in 10 minutes simple-mod complex conversation    Time 6   Period Weeks  or sessions, for all LTGs   Status New     SLP LONG TERM GOAL #2   Title pt will           Plan - 03/18/17 1717    Clinical Impression Statement Pt with what appears to be functional dysphonia. SLP was, for approx 15 minutes, able to shape pt's voice to WNL in diagnostic evaluation environment today. In conversation following eval, pt's vocal quality was noted to have improved but still not WNL as in the diagnostic evaluation situation. Pt showed decr'd awareness to WNL/abnormal voicing as she stated "well, sort of" when SLP asked if her WNL-produced voice sounded like her real voice. Pt with abnormal breathing pattern for speech, hyperinhaling frequently. Pt would benefit from skilled ST focusing on increasing awareness of WNL voicing by decreasing strained/breathy voice, and improving breath support for speech.    Speech Therapy Frequency 1x /week   Duration --  6 weeks   Treatment/Interventions Cueing hierarchy;Functional tasks;Patient/family education;SLP instruction and feedback;Internal/external aids;Environmental controls  HEP for voice; any or all treatments may be used   Potential to Achieve Goals Good   Potential Considerations Cooperation/participation level  pt "buy in" is essential for success in functional dysphonia   SLP Home Exercise Plan provided today -see "pt instructions"   Consulted and Agree with Plan of Care Patient      Patient will benefit from skilled therapeutic intervention  in order to improve the following deficits and impairments:   Dysphonia    Problem List Patient Active Problem List   Diagnosis Date Noted  . AP (abdominal pain)   . Occult blood in stools   . Esophageal reflux   . Heme + stool 12/28/2014  . GERD (gastroesophageal reflux disease) 12/28/2014  . Gastritis 08/20/2011  . Obesity, Class II, BMI 35-39.9, isolated 08/20/2011  . IBS (irritable bowel syndrome) 05/29/2011  . Abdominal pain 05/21/2011  . Nausea 05/21/2011    Julus Kelley ,MS, CCC-SLP  03/18/2017, 5:30 PM  [facility]  [facility] [address] [place], [place], [address] Phone: [phone]   Fax:  [phone]  Name: Lisa Garner MRN: [id number] Date of Birth: December 28, 1970

## 2017-05-21 DIAGNOSIS — I10 Essential (primary) hypertension: Secondary | ICD-10-CM | POA: Diagnosis not present

## 2017-05-21 DIAGNOSIS — E782 Mixed hyperlipidemia: Secondary | ICD-10-CM | POA: Diagnosis not present

## 2017-05-21 DIAGNOSIS — R7301 Impaired fasting glucose: Secondary | ICD-10-CM | POA: Diagnosis not present

## 2017-06-04 DIAGNOSIS — Z0000 Encounter for general adult medical examination without abnormal findings: Secondary | ICD-10-CM | POA: Diagnosis not present

## 2017-06-04 DIAGNOSIS — K219 Gastro-esophageal reflux disease without esophagitis: Secondary | ICD-10-CM | POA: Diagnosis not present

## 2017-06-04 DIAGNOSIS — E782 Mixed hyperlipidemia: Secondary | ICD-10-CM | POA: Diagnosis not present

## 2017-07-07 DIAGNOSIS — M546 Pain in thoracic spine: Secondary | ICD-10-CM | POA: Diagnosis not present

## 2017-07-07 DIAGNOSIS — M542 Cervicalgia: Secondary | ICD-10-CM | POA: Diagnosis not present

## 2017-07-07 DIAGNOSIS — M545 Low back pain: Secondary | ICD-10-CM | POA: Diagnosis not present

## 2017-07-13 DIAGNOSIS — M545 Low back pain: Secondary | ICD-10-CM | POA: Diagnosis not present

## 2017-07-13 DIAGNOSIS — M546 Pain in thoracic spine: Secondary | ICD-10-CM | POA: Diagnosis not present

## 2017-07-13 DIAGNOSIS — M542 Cervicalgia: Secondary | ICD-10-CM | POA: Diagnosis not present

## 2017-07-15 DIAGNOSIS — M542 Cervicalgia: Secondary | ICD-10-CM | POA: Diagnosis not present

## 2017-07-15 DIAGNOSIS — M545 Low back pain: Secondary | ICD-10-CM | POA: Diagnosis not present

## 2017-07-15 DIAGNOSIS — M546 Pain in thoracic spine: Secondary | ICD-10-CM | POA: Diagnosis not present

## 2017-07-19 DIAGNOSIS — M542 Cervicalgia: Secondary | ICD-10-CM | POA: Diagnosis not present

## 2017-07-19 DIAGNOSIS — M546 Pain in thoracic spine: Secondary | ICD-10-CM | POA: Diagnosis not present

## 2017-07-19 DIAGNOSIS — M545 Low back pain: Secondary | ICD-10-CM | POA: Diagnosis not present

## 2017-07-28 DIAGNOSIS — M545 Low back pain: Secondary | ICD-10-CM | POA: Diagnosis not present

## 2017-07-28 DIAGNOSIS — M542 Cervicalgia: Secondary | ICD-10-CM | POA: Diagnosis not present

## 2017-07-28 DIAGNOSIS — M546 Pain in thoracic spine: Secondary | ICD-10-CM | POA: Diagnosis not present

## 2017-09-23 DIAGNOSIS — Z6836 Body mass index (BMI) 36.0-36.9, adult: Secondary | ICD-10-CM | POA: Diagnosis not present

## 2017-09-23 DIAGNOSIS — Z1231 Encounter for screening mammogram for malignant neoplasm of breast: Secondary | ICD-10-CM | POA: Diagnosis not present

## 2017-09-23 DIAGNOSIS — Z1212 Encounter for screening for malignant neoplasm of rectum: Secondary | ICD-10-CM | POA: Diagnosis not present

## 2017-09-23 DIAGNOSIS — Z01419 Encounter for gynecological examination (general) (routine) without abnormal findings: Secondary | ICD-10-CM | POA: Diagnosis not present

## 2017-10-18 DIAGNOSIS — Z6835 Body mass index (BMI) 35.0-35.9, adult: Secondary | ICD-10-CM | POA: Diagnosis not present

## 2017-10-18 DIAGNOSIS — J019 Acute sinusitis, unspecified: Secondary | ICD-10-CM | POA: Diagnosis not present

## 2017-12-29 DIAGNOSIS — Z6835 Body mass index (BMI) 35.0-35.9, adult: Secondary | ICD-10-CM | POA: Diagnosis not present

## 2017-12-29 DIAGNOSIS — R110 Nausea: Secondary | ICD-10-CM | POA: Diagnosis not present

## 2017-12-29 DIAGNOSIS — R070 Pain in throat: Secondary | ICD-10-CM | POA: Diagnosis not present

## 2017-12-29 DIAGNOSIS — K5900 Constipation, unspecified: Secondary | ICD-10-CM | POA: Diagnosis not present

## 2017-12-29 DIAGNOSIS — R1030 Lower abdominal pain, unspecified: Secondary | ICD-10-CM | POA: Diagnosis not present

## 2017-12-29 DIAGNOSIS — J019 Acute sinusitis, unspecified: Secondary | ICD-10-CM | POA: Diagnosis not present

## 2018-07-14 DIAGNOSIS — Q828 Other specified congenital malformations of skin: Secondary | ICD-10-CM | POA: Diagnosis not present

## 2018-07-14 DIAGNOSIS — Z79899 Other long term (current) drug therapy: Secondary | ICD-10-CM | POA: Diagnosis not present

## 2018-09-01 NOTE — Therapy (Signed)
[facility] [address] [place], [place], [address] Phone: [phone]   Fax:  [phone]  Patient Details  Name: Lisa Garner MRN: [id number] Date of Birth: 1970/11/20 Referring Provider: Izora Gala, MD  Encounter Date: 09/01/2018  SPEECH THERAPY DISCHARGE SUMMARY  Visits from Start of Care: One (evaluation)  Current functional level related to goals / functional outcomes: Pt was seen for ST eval and did not choose to schedule ST sessions.    Remaining deficits: Goals were as follows after eval 03-18-17: SLP Short Term Goals - 03/18/17 1724              SLP SHORT TERM GOAL #1    Title pt demo abdominal breathing in 18/20 sentence responses    Time 4    Period Weeks  or sessions, for all STGs    Status New         SLP SHORT TERM GOAL #2    Title pt will demo WNL voicing in 18/20 sentence responses    Time 4    Period Weeks    Status New         SLP SHORT TERM GOAL #3    Title pt will ID strained vs. WNL voicing correctly during sessions when asked 95% of the time    Time 4    Status New                       SLP Long Term Goals - 03/18/17 1726              SLP LONG TERM GOAL #1    Title pt will produce WNL speech in 10 minutes simple-mod complex conversation     Time 6    Period Weeks  or sessions, for all LTGs    Status New           Education / Equipment: WNL voicing, eval results.   Plan: Patient agrees to discharge.  Patient goals were not met. Patient is being discharged due to not returning since the last visit.  ?????       [place] ,MS, CCC-SLP  09/01/2018, 10:59 AM  [facility] [address] [place], [place], [address] Phone: [phone]   Fax:  [phone]

## 2018-10-27 DIAGNOSIS — Z6833 Body mass index (BMI) 33.0-33.9, adult: Secondary | ICD-10-CM | POA: Diagnosis not present

## 2018-10-27 DIAGNOSIS — Z01419 Encounter for gynecological examination (general) (routine) without abnormal findings: Secondary | ICD-10-CM | POA: Diagnosis not present

## 2018-10-27 DIAGNOSIS — Z1231 Encounter for screening mammogram for malignant neoplasm of breast: Secondary | ICD-10-CM | POA: Diagnosis not present

## 2018-12-26 NOTE — Progress Notes (Signed)
REVIEWED-NO ADDITIONAL RECOMMENDATIONS. 

## 2019-04-05 DIAGNOSIS — Z0000 Encounter for general adult medical examination without abnormal findings: Secondary | ICD-10-CM | POA: Diagnosis not present

## 2019-04-13 DIAGNOSIS — Z136 Encounter for screening for cardiovascular disorders: Secondary | ICD-10-CM | POA: Diagnosis not present

## 2019-04-13 DIAGNOSIS — R011 Cardiac murmur, unspecified: Secondary | ICD-10-CM | POA: Diagnosis not present

## 2019-04-13 DIAGNOSIS — I959 Hypotension, unspecified: Secondary | ICD-10-CM | POA: Diagnosis not present

## 2019-04-13 DIAGNOSIS — Z0001 Encounter for general adult medical examination with abnormal findings: Secondary | ICD-10-CM | POA: Diagnosis not present

## 2019-04-13 DIAGNOSIS — R42 Dizziness and giddiness: Secondary | ICD-10-CM | POA: Diagnosis not present

## 2019-04-13 DIAGNOSIS — E782 Mixed hyperlipidemia: Secondary | ICD-10-CM | POA: Diagnosis not present

## 2019-04-25 NOTE — Progress Notes (Addendum)
Cardiology Office Note   Date:  04/27/2019   ID:  Lisa Garner, DOB 03-Mar-1971, MRN [id number]  PCP:  Celene Squibb, MD  Cardiologist:   No primary care provider on file. Referring:  Celene Squibb, MD  No chief complaint on file.     History of Present Illness: Lisa Garner is a 48 y.o. female who is referred by Celene Squibb, MD for evaluation of a heart murmur.  She did have an echo in 2013.  She had mild pulmonary valve stenosis.  She had this history from childhood.  She was seen 7 years ago by Cr. Croitoru and was having no issues.  About 2-1/2 weeks ago she started having dizziness.  Her blood pressure was actually low running around 100/60 with her normal being around 117/70.  She had negative orthostatics in her primary care office.  She was told she had fluid behind her ear and she was told to take some phenylephrine which helped.  However, since that time she is still had some episodes of feeling like she needs to take a deep breath.  She has had a little decreased exercise tolerance.  She has been more fatigued with normal activity such as walking across level ground.  She is not describing any PND or orthopnea.  She is not having any palpitations, presyncope or syncope.  She has a little bit of chest heaviness occasionally.  She had mild cough nonproductive.  She is not had fevers or chills.  She has had no weight gain or edema.  Of note her dizziness was kind of a postural thing lying on one side.  She felt like she could not walk a straight line.  She is used to doing her activities of daily living and her chores.  Past Medical History:  Diagnosis Date  . Claudication [facility])    Lower arterial duplex scan 01/05/08 - Normal  . Congenital pulmonary stenosis    mild  . Fatty liver   . GERD (gastroesophageal reflux disease)   . Helicobacter pylori gastritis 2005  . IBS (irritable bowel syndrome)   . Pulmonary hypertension (HCC)    TTE 10/23/11 - LV size normal. LV EF  50-55%  . S/P endoscopy 05/25/08   Dr Maryclare Bean small bowel biopsy, reactive gastropathy  . Skin disorder     Past Surgical History:  Procedure Laterality Date  . CHOLECYSTECTOMY  1997   ERCP with sphincterotomy/stone extraction  . COLONOSCOPY N/A 01/18/2015   Procedure: COLONOSCOPY;  Surgeon: Danie Binder, MD;  [facility];  Service: Endoscopy;  Laterality: N/A;  1245 - moved to 1:30 - office to notify  . DILATION AND CURETTAGE OF UTERUS    . ENDOMETRIAL ABLATION    . ESOPHAGOGASTRODUODENOSCOPY  05/25/2008   [id number] small bowel   . ESOPHAGOGASTRODUODENOSCOPY N/A 01/18/2015   Procedure: ESOPHAGOGASTRODUODENOSCOPY (EGD);  Surgeon: Danie Binder, MD;  [facility];  Service: Endoscopy;  Laterality: N/A;  . KNEE SURGERY Left 02/1989  . TONSILLECTOMY    . WISDOM TOOTH EXTRACTION  11/1989     Current Outpatient Medications  Medication Sig Dispense Refill  . calcium carbonate (OS-CAL) 600 MG TABS Take 600 mg by mouth 2 (two) times daily with a meal.      . cholecalciferol (VITAMIN D) 1000 UNITS tablet Take 2,000 Units by mouth daily.     Marland Kitchen dicyclomine (BENTYL) 10 MG capsule Take 1 capsule (10 mg total) by mouth 3 (three) times  daily as needed for spasms. 90 capsule 1  . levocetirizine (XYZAL) 5 MG tablet Take 5 mg by mouth daily.    . magnesium gluconate (MAGONATE) 500 MG tablet Take 500 mg by mouth daily.    . Multiple Vitamin (MULTIVITAMIN) capsule Take 1 capsule by mouth daily.      . pantoprazole (PROTONIX) 40 MG tablet   4  . progesterone (PROMETRIUM) 200 MG capsule Take 200 mg by mouth daily.     No current facility-administered medications for this visit.     Allergies:   Levbid [hyoscyamine sulfate]    Social History:  The patient  reports that she has never smoked. She has never used smokeless tobacco. She reports current alcohol use. She reports that she does not use drugs.   Family History:  The patient's family history includes Celiac  disease in her father; Colon polyps in her mother; Hyperlipidemia in her mother and sister; Leukemia in her father.    ROS:  Please see the history of present illness.   Otherwise, review of systems are positive for none.   All other systems are reviewed and negative.    PHYSICAL EXAM: VS:  BP 122/78   Pulse 89   Ht 5\' 4"  (1.626 m)   Wt 197 lb 9.6 oz (89.6 kg)   SpO2 96%   BMI 33.92 kg/m  , BMI Body mass index is 33.92 kg/m. GENERAL:  Well appearing HEENT:  Pupils equal round and reactive, fundi not visualized, oral mucosa unremarkable NECK:  No jugular venous distention, waveform within normal limits, carotid upstroke brisk and symmetric, no bruits, no thyromegaly LYMPHATICS:  No cervical, inguinal adenopathy LUNGS:  Clear to auscultation bilaterally BACK:  No CVA tenderness CHEST:  Unremarkable HEART:  PMI not displaced or sustained,S1 and S2 with prominent P2 component and normal splitting no S3, no S4, no clicks, no rubs, 2 out of 6 left upper sternal border grade systolic murmur murmurs ABD:  Flat, positive bowel sounds normal in frequency in pitch, no bruits, no rebound, no guarding, no midline pulsatile mass, no hepatomegaly, no splenomegaly EXT:  2 plus pulses throughout, no edema, no cyanosis no clubbing SKIN:  No rashes no nodules NEURO:  Cranial nerves II through XII grossly intact, motor grossly intact throughout PSYCH:  Cognitively intact, oriented to person place and time    EKG:  EKG is ordered today. The ekg ordered today demonstrates sinus rhythm, rate 86, axis within normal limits, intervals within normal limits, no acute ST-T wave changes.   Recent Labs: No results found for requested labs within last 8760 hours.    Lipid Panel No results found for: CHOL, TRIG, HDL, CHOLHDL, VLDL, LDLCALC, LDLDIRECT    Wt Readings from Last 3 Encounters:  04/27/19 197 lb 9.6 oz (89.6 kg)  12/28/14 200 lb (90.7 kg)  08/20/11 215 lb 9.6 oz (97.8 kg)      Other  studies Reviewed: Additional studies/ records that were reviewed today include: Echo 2013. Review of the above records demonstrates:  Please see elsewhere in the note.     ASSESSMENT AND PLAN:  Pulmonic Stenosis:   This is evident on physical exam but I do not suspect it is severe.   Dizziness: The etiology of this is not entirely clear.  I will start with the echo as above.  I suspect this was more related to an inner ear problem or middle ear problem.  At this point I am not thinking about an arrhythmia and not suspecting  he will need a change in therapy.  Decreased exercise tolerance: This is her most concerning issue.  He felt winded walking around the office although her oxygen level stayed high in the mid to high 90s.  Her heart rate has been going up but not down.  This does not seem to be a cardiopulmonary problem.  I do see that she has recent blood work.  I do not have access to all of these results but she states she was not anemic and her thyroid was normal.  I might suggest to her primary provider considering a cortisol level given the low blood pressure and the fatigue.  I will check the echo as above but doubt a cardiac etiology.  Current medicines are reviewed at length with the patient today.  The patient does not have concerns regarding medicines.  The following changes have been made:  no change  Labs/ tests ordered today include:   Orders Placed This Encounter  Procedures  . ECHOCARDIOGRAM COMPLETE     Disposition:   FU with me as needewd.     Signed, Minus Breeding, MD  04/27/2019 9:43 AM    [facility]

## 2019-04-27 ENCOUNTER — Other Ambulatory Visit: Payer: Self-pay

## 2019-04-27 ENCOUNTER — Encounter: Payer: Self-pay | Admitting: Cardiology

## 2019-04-27 ENCOUNTER — Ambulatory Visit (INDEPENDENT_AMBULATORY_CARE_PROVIDER_SITE_OTHER): Payer: BC Managed Care – PPO | Admitting: Cardiology

## 2019-04-27 VITALS — BP 122/78 | HR 89 | Ht 64.0 in | Wt 197.6 lb

## 2019-04-27 DIAGNOSIS — I370 Nonrheumatic pulmonary valve stenosis: Secondary | ICD-10-CM

## 2019-04-27 DIAGNOSIS — R42 Dizziness and giddiness: Secondary | ICD-10-CM | POA: Diagnosis not present

## 2019-04-27 DIAGNOSIS — R011 Cardiac murmur, unspecified: Secondary | ICD-10-CM | POA: Diagnosis not present

## 2019-04-27 DIAGNOSIS — I959 Hypotension, unspecified: Secondary | ICD-10-CM | POA: Diagnosis not present

## 2019-04-27 NOTE — Addendum Note (Signed)
Addended by: Jeremy Johann on: 04/27/2019 11:29 AM   Modules accepted: Orders

## 2019-04-27 NOTE — Patient Instructions (Signed)
Medication Instructions:  Your physician recommends that you continue on your current medications as directed. Please refer to the Current Medication list given to you today.  If you need a refill on your cardiac medications before your next appointment, please call your pharmacy.   Lab work: NONE   Testing/Procedures: Your physician has requested that you have an echocardiogram. Echocardiography is a painless test that uses sound waves to create images of your heart. It provides your doctor with information about the size and shape of your heart and how well your heart's chambers and valves are working. This procedure takes approximately one hour. There are no restrictions for this procedure. [facility] [address]  Follow-Up: AS NEEDED

## 2019-05-05 ENCOUNTER — Other Ambulatory Visit: Payer: Self-pay

## 2019-05-05 ENCOUNTER — Ambulatory Visit (HOSPITAL_COMMUNITY): Payer: BC Managed Care – PPO | Attending: Cardiovascular Disease

## 2019-05-05 DIAGNOSIS — I370 Nonrheumatic pulmonary valve stenosis: Secondary | ICD-10-CM | POA: Insufficient documentation

## 2019-05-05 DIAGNOSIS — R011 Cardiac murmur, unspecified: Secondary | ICD-10-CM | POA: Insufficient documentation

## 2019-05-11 DIAGNOSIS — R011 Cardiac murmur, unspecified: Secondary | ICD-10-CM | POA: Diagnosis not present

## 2019-05-11 DIAGNOSIS — I959 Hypotension, unspecified: Secondary | ICD-10-CM | POA: Diagnosis not present

## 2019-05-11 DIAGNOSIS — R42 Dizziness and giddiness: Secondary | ICD-10-CM | POA: Diagnosis not present

## 2019-05-15 ENCOUNTER — Telehealth: Payer: Self-pay | Admitting: Cardiology

## 2019-05-15 NOTE — Telephone Encounter (Signed)
New message     Patient did not receive Echo results. Please call after 10:00am to work phone to give results.

## 2019-05-15 NOTE — Telephone Encounter (Signed)
Spoke with pt and reviewed 9/4 echo results. Advised her that these have been released to [place] and mailed to her address on file. Reactivated mychart account while on the phone with pt and sent text message to pt instructing her to follow steps to log into her account. She states that during an appt with Faustino Congress, NP in her PCP office, her echo results were reviewed and she was told that her left ventricle was not relaxing as it should and she was also diagnosed with POTS. She states she is concerned because she was referred to Dr. Percival Spanish d/t hypotension and dizziness and would like to know his plan to address her impaired relaxation to left ventricle and POTS. Informed her that regarding impaired relaxation, plan may depend on the severity, but triage nurse would route message to him for review. Pt verbalized understanding

## 2019-05-23 DIAGNOSIS — M542 Cervicalgia: Secondary | ICD-10-CM | POA: Diagnosis not present

## 2019-05-23 DIAGNOSIS — M25512 Pain in left shoulder: Secondary | ICD-10-CM | POA: Diagnosis not present

## 2019-05-25 DIAGNOSIS — R011 Cardiac murmur, unspecified: Secondary | ICD-10-CM | POA: Diagnosis not present

## 2019-05-25 DIAGNOSIS — Z0001 Encounter for general adult medical examination with abnormal findings: Secondary | ICD-10-CM | POA: Diagnosis not present

## 2019-05-25 DIAGNOSIS — R42 Dizziness and giddiness: Secondary | ICD-10-CM | POA: Diagnosis not present

## 2019-05-25 DIAGNOSIS — I959 Hypotension, unspecified: Secondary | ICD-10-CM | POA: Diagnosis not present

## 2019-05-25 NOTE — Telephone Encounter (Signed)
Spoke with patient and she stated she saw her PCP today and was told she was going to reach out to Dr Percival Spanish. Per patient her PCP diagnosed her with POTS, asked that she have notes faxed to office so Dr Percival Spanish could review. Also patient c/o shortness of breath. Offered her follow up appointment, patient declined and will wait to have her PCP call and speak with Dr Percival Spanish. Patient stated she would send PCP message requesting notes be sent and to let her know Dr Percival Spanish will be in office tomorrow.

## 2019-05-25 NOTE — Telephone Encounter (Signed)
I would need to see the HR readings that were used by her PCP to diagnose the POTS.  I did not see a significant orthostatic change in the previous notes.  The diastolic HF is mild and requires only BP control at this point.  We would be happy to see her back in a year and we could repeat an echo.

## 2019-05-25 NOTE — Telephone Encounter (Addendum)
Left message on cell number to call back, patient no working today

## 2019-05-29 DIAGNOSIS — G5622 Lesion of ulnar nerve, left upper limb: Secondary | ICD-10-CM | POA: Diagnosis not present

## 2019-06-05 ENCOUNTER — Other Ambulatory Visit: Payer: Self-pay

## 2019-06-05 ENCOUNTER — Encounter: Payer: Self-pay | Admitting: Cardiology

## 2019-06-05 ENCOUNTER — Ambulatory Visit (INDEPENDENT_AMBULATORY_CARE_PROVIDER_SITE_OTHER): Payer: BC Managed Care – PPO | Admitting: Cardiology

## 2019-06-05 VITALS — BP 122/85 | HR 92 | Temp 98.0°F | Ht 64.0 in | Wt 202.0 lb

## 2019-06-05 DIAGNOSIS — I370 Nonrheumatic pulmonary valve stenosis: Secondary | ICD-10-CM | POA: Diagnosis not present

## 2019-06-05 DIAGNOSIS — R42 Dizziness and giddiness: Secondary | ICD-10-CM | POA: Diagnosis not present

## 2019-06-05 NOTE — Progress Notes (Signed)
Primary Physician/Referring:  Celene Squibb, MD  Patient ID: Lisa Garner, female    DOB: August 16, 1971, 48 y.o.   MRN: [id number]  Chief Complaint  Patient presents with  . pulomonic stenosis  . POTS  . New Patient (Initial Visit)   HPI:    Lisa Garner  is a 48 y.o. is a Caucasian female with mild congenital pulmonary valve stenosis, obesity, irritable bowel syndrome and recent diagnosis of gluten sensitivity, has lost about 20 pounds in weight over the past 2 years, referred to me on an urgent basis for evaluation of dizziness.  Her working diagnosis was "postural orthostatic tachycardia syndrome"- pots.  Patient describes dizziness that started about 4 to 5 weeks ago, she just was not feeling well.  Felt generally weak and thought that she was going to pass out.  She did take some decongestant which helped her feelings but did not completely subside.  She was also prescribed Florinef which he took for a short time but then discontinued this as she was started on Medrol Dosepak for left elbow arthritis/sprain.  She completed her steroid course last week.  Symptoms of dizziness have improved over time.  Denies palpitations or chest pain.  She occasionally feels like she has to take a deep breath and has mild chronic dyspnea with exertional activity that has remained stable over many years and attributes this to obesity.  She is a non-smoker.  Past Medical History:  Diagnosis Date  . Claudication [facility])    Lower arterial duplex scan 01/05/08 - Normal  . Congenital pulmonary stenosis    mild  . Fatty liver   . GERD (gastroesophageal reflux disease)   . Helicobacter pylori gastritis 2005  . IBS (irritable bowel syndrome)   . Pulmonary hypertension (HCC)    TTE 10/23/11 - LV size normal. LV EF 50-55%  . S/P endoscopy 05/25/08   Dr Maryclare Bean small bowel biopsy, reactive gastropathy  . Skin disorder    Past Surgical History:  Procedure Laterality Date  . CHOLECYSTECTOMY   1997   ERCP with sphincterotomy/stone extraction  . COLONOSCOPY N/A 01/18/2015   Procedure: COLONOSCOPY;  Surgeon: Danie Binder, MD;  [facility];  Service: Endoscopy;  Laterality: N/A;  1245 - moved to 1:30 - office to notify  . DILATION AND CURETTAGE OF UTERUS    . ENDOMETRIAL ABLATION    . ESOPHAGOGASTRODUODENOSCOPY  05/25/2008   [id number] small bowel   . ESOPHAGOGASTRODUODENOSCOPY N/A 01/18/2015   Procedure: ESOPHAGOGASTRODUODENOSCOPY (EGD);  Surgeon: Danie Binder, MD;  [facility];  Service: Endoscopy;  Laterality: N/A;  . KNEE SURGERY Left 02/1989  . TONSILLECTOMY    . WISDOM TOOTH EXTRACTION  11/1989   Social History   Socioeconomic History  . Marital status: Married    Spouse name: Not on file  . Number of children: 2  . Years of education: Not on file  . Highest education level: Not on file  Occupational History  . Occupation: Health and safety inspector  Social Needs  . Financial resource strain: Not on file  . Food insecurity    Worry: Not on file    Inability: Not on file  . Transportation needs    Medical: Not on file    Non-medical: Not on file  Tobacco Use  . Smoking status: Never Smoker  . Smokeless tobacco: Never Used  Substance and Sexual Activity  . Alcohol use: Yes    Alcohol/week: 0.0 standard drinks    Comment:  socially one glass of wine per week  . Drug use: No  . Sexual activity: Yes    Partners: Male    Comment: No birth control  Lifestyle  . Physical activity    Days per week: Not on file    Minutes per session: Not on file  . Stress: Not on file  Relationships  . Social Herbalist on phone: Not on file    Gets together: Not on file    Attends religious service: Not on file    Active member of club or organization: Not on file    Attends meetings of clubs or organizations: Not on file    Relationship status: Not on file  . Intimate partner violence    Fear of current or ex partner: Not on file     Emotionally abused: Not on file    Physically abused: Not on file    Forced sexual activity: Not on file  Other Topics Concern  . Not on file  Social History Narrative   Lives at home with husband.  He has two children.    ROS  Review of Systems  Constitution: Negative for chills, decreased appetite, malaise/fatigue and weight gain.  Cardiovascular: Negative for dyspnea on exertion, leg swelling and syncope.  Endocrine: Negative for cold intolerance.  Hematologic/Lymphatic: Does not bruise/bleed easily.  Musculoskeletal: Positive for joint pain (left elbow). Negative for joint swelling.  Gastrointestinal: Negative for abdominal pain, anorexia, change in bowel habit, hematochezia and melena.  Neurological: Positive for dizziness. Negative for headaches and light-headedness.  Psychiatric/Behavioral: Negative for depression and substance abuse.  All other systems reviewed and are negative.  Objective   Vitals with BMI 06/05/2019 04/27/2019 01/18/2015  Height 5' 4"  5' 4"  -  Weight 202 lbs 197 lbs 10 oz -  BMI 21.22 48.2 -  Systolic 500 370 488  Diastolic 89 78 81  Pulse 79 89 85    Blood pressure 132/89, pulse 79, temperature 98 F (36.7 C), height 5' 4"  (1.626 m), weight 202 lb (91.6 kg), SpO2 99 %. Body mass index is 34.67 kg/m.   Physical Exam  Constitutional: No distress.  moderately built and moderately obese in no acute distress.  HENT:  Head: Atraumatic.  Eyes: Conjunctivae are normal.  Neck: Neck supple. No JVD present. No thyromegaly present.  Cardiovascular: Normal rate, regular rhythm, intact distal pulses and normal pulses. Exam reveals no gallop.  No murmur heard. S1 and S2 is normal, ejection click heard in the left parasternal border.  2/6 crescendo midsystolic murmur heard in the left upper sternal border. There is no leg edema, there is no JVD.  Pulmonary/Chest: Effort normal and breath sounds normal.  Abdominal: Soft. Bowel sounds are normal.   Musculoskeletal: Normal range of motion.  Neurological: She is alert.  Skin: Skin is warm and dry.  Psychiatric: She has a normal mood and affect.   Radiology: No results found.  Laboratory examination:   Labs 04/04/2019: HB 14.0/HCT 43.0, platelets 304, normal indicis.  Serum glucose 84 mg, BUN 13, creatinine 0.90, eGFR greater than 60 him up, CMP normal.  Total cholesterol: 2, triglycerides 106, HDL 55, LDL 126.  No results for input(s): NA, K, CL, CO2, GLUCOSE, BUN, CREATININE, CALCIUM, GFRNONAA, GFRAA in the last 8760 hours. CMP Latest Ref Rng & Units 01/02/2015 05/21/2011  Glucose 70 - 99 mg/dL 103(H) 98  BUN 6 - 23 mg/dL 13 9  Creatinine 0.50 - 1.10 mg/dL 0.91 0.84  Sodium 135 -  145 mEq/L 140 140  Potassium 3.5 - 5.3 mEq/L 4.1 4.3  Chloride 96 - 112 mEq/L 105 101  CO2 19 - 32 mEq/L 32 30  Calcium 8.4 - 10.5 mg/dL 8.7 9.6  Total Protein 6.0 - 8.3 g/dL 6.4 6.6  Total Bilirubin 0.2 - 1.2 mg/dL 0.4 0.3  Alkaline Phos 39 - 117 U/L 47 64  AST 0 - 37 U/L 18 17  ALT 0 - 35 U/L 12 12   CBC Latest Ref Rng & Units 01/02/2015 05/21/2011  WBC 4.0 - 10.5 K/uL 6.4 7.9  Hemoglobin 12.0 - 15.0 g/dL 12.9 13.5  Hematocrit 36.0 - 46.0 % 38.7 41.7  Platelets 150 - 400 K/uL 257 290   Lipid Panel  No results found for: CHOL, TRIG, HDL, CHOLHDL, VLDL, LDLCALC, LDLDIRECT HEMOGLOBIN A1C No results found for: HGBA1C, MPG TSH No results for input(s): TSH in the last 8760 hours. Medications and allergies   Allergies  Allergen Reactions  . Levbid [Hyoscyamine Sulfate] Rash     Prior to Admission medications   Medication Sig Start Date End Date Taking? Authorizing Provider  calcium carbonate (OS-CAL) 600 MG TABS Take 600 mg by mouth 2 (two) times daily with a meal.     Yes [provider]  cholecalciferol (VITAMIN D) 1000 UNITS tablet Take 2,000 Units by mouth daily.    Yes [provider]  dicyclomine (BENTYL) 10 MG capsule Take 1 capsule (10 mg total) by mouth 3 (three) times  daily as needed for spasms. 12/28/14  Yes Carlis Stable, NP  ibuprofen (ADVIL) 800 MG tablet Take 800 mg by mouth every 8 (eight) hours as needed.   Yes [provider]  levocetirizine (XYZAL) 5 MG tablet Take 5 mg by mouth daily. 11/29/18  Yes [provider]  magnesium gluconate (MAGONATE) 500 MG tablet Take 500 mg by mouth daily.   Yes [provider]  meloxicam (MOBIC) 15 MG tablet Take 15 mg by mouth daily as needed for pain.   Yes [provider]  Multiple Vitamin (MULTIVITAMIN) capsule Take 1 capsule by mouth daily.     Yes [provider]  pantoprazole (PROTONIX) 40 MG tablet  12/10/14  Yes [provider]  progesterone (PROMETRIUM) 200 MG capsule Take 200 mg by mouth daily. 01/18/19  Yes [provider]  tiZANidine (ZANAFLEX) 4 MG capsule Take 4 mg by mouth 3 (three) times daily as needed for muscle spasms.   Yes [provider]     Current Outpatient Medications  Medication Instructions  . calcium carbonate (OS-CAL) 600 mg, 2 times daily with meals  . cholecalciferol (VITAMIN D) 2,000 Units, Oral, Daily  . dicyclomine (BENTYL) 10 mg, Oral, 3 times daily PRN  . ibuprofen (ADVIL) 800 mg, Oral, Every 8 hours PRN  . levocetirizine (XYZAL) 5 mg, Oral, Daily  . magnesium gluconate (MAGONATE) 500 mg, Oral, Daily  . meloxicam (MOBIC) 15 mg, Oral, Daily PRN  . Multiple Vitamin (MULTIVITAMIN) capsule 1 capsule, Daily  . pantoprazole (PROTONIX) 40 MG tablet No dose, route, or frequency recorded.  . progesterone (PROMETRIUM) 200 mg, Oral, Daily  . tiZANidine (ZANAFLEX) 4 mg, Oral, 3 times daily PRN    Cardiac Studies:   Echocardiogram 05/05/2019:  1. The left ventricle has normal systolic function, with an ejection fraction of 55-60%. The cavity size was normal. Left ventricular diastolic Doppler parameters are consistent with impaired relaxation. No evidence of left ventricular regional wall  motion abnormalities.  2.  The right ventricle  has normal systolic function. The cavity was normal. There is no increase in right ventricular wall thickness. Right ventricular systolic pressure is normal with an estimated pressure of 29.6 mmHg. 3.  Compared to 10/23/2011 and 10/19/2008 echocardiogram performed at [facility], mild RV dilatation and mild pulmonary stenosis not present.  Assessment     ICD-10-CM   1. Nonrheumatic pulmonary valve stenosis  I37.0 EKG 12-Lead  2. Palpitations  R00.2     EKG 06/05/2019: Normal sinus rhythm with rate of 72 bpm, left atrial enlargement, leftward axis, incomplete right bundle branch block.  No evidence of ischemia.  Recommendations:   Patient referred to me on a stat basis for for evaluation of dizziness.  I reviewed her medical records from Dr. Percival Spanish consult and also his echocardiogram.  Although there is no mention of pulmonary valve stenosis, she clearly has an ejection click and a early systolic murmur in the pulmonary area consistent with mild pulmonic stenosis.   Patient's symptoms of dizziness is clearly related to low blood pressure but I do not think she has "postural orthostatic tachycardia syndrome". She was previously orthostatic negative in PCP office and also orthostatic negative today in our office.   I will wait for her to declare herself whether she has "POTS".  She generally has low blood pressure even before the onset of all of her symptoms about 4-5 weeks ago.  She generally has systolic blood pressure in 195-108 mmHg.  She is presently off of fludrocortisone as she was taking prednisone Dosepak for left elbow arthritis.  She took last dose of week ago.  For now I have recommended conservative therapy with support stockings, "counterpressure maneuvers" and increasing her salt intake.  Weight loss was discussed with the patient.  I will see her back in 3 months and hopefully she will also lost some weight.  I do not have a TSH but patient states that her labs  have been normal.  We will request Dr. Delphina Cahill to follow-up on this if not done.  Adrian Prows, MD, [facility] 06/05/2019, 1:31 PM [facility] [place] Pager: [phone] Office: [phone] If no answer Cell [phone]

## 2019-06-15 DIAGNOSIS — M25522 Pain in left elbow: Secondary | ICD-10-CM | POA: Diagnosis not present

## 2019-06-15 DIAGNOSIS — M25529 Pain in unspecified elbow: Secondary | ICD-10-CM | POA: Insufficient documentation

## 2019-06-15 DIAGNOSIS — G5622 Lesion of ulnar nerve, left upper limb: Secondary | ICD-10-CM | POA: Diagnosis not present

## 2019-06-15 DIAGNOSIS — G5620 Lesion of ulnar nerve, unspecified upper limb: Secondary | ICD-10-CM | POA: Insufficient documentation

## 2019-07-04 DIAGNOSIS — G5622 Lesion of ulnar nerve, left upper limb: Secondary | ICD-10-CM | POA: Diagnosis not present

## 2019-07-04 DIAGNOSIS — M62532 Muscle wasting and atrophy, not elsewhere classified, left forearm: Secondary | ICD-10-CM | POA: Diagnosis not present

## 2019-07-19 DIAGNOSIS — Z4789 Encounter for other orthopedic aftercare: Secondary | ICD-10-CM | POA: Insufficient documentation

## 2019-07-19 DIAGNOSIS — R29898 Other symptoms and signs involving the musculoskeletal system: Secondary | ICD-10-CM | POA: Diagnosis not present

## 2019-08-02 DIAGNOSIS — R29898 Other symptoms and signs involving the musculoskeletal system: Secondary | ICD-10-CM | POA: Diagnosis not present

## 2019-08-09 DIAGNOSIS — R29898 Other symptoms and signs involving the musculoskeletal system: Secondary | ICD-10-CM | POA: Diagnosis not present

## 2019-08-23 DIAGNOSIS — R29898 Other symptoms and signs involving the musculoskeletal system: Secondary | ICD-10-CM | POA: Diagnosis not present

## 2019-09-07 ENCOUNTER — Ambulatory Visit: Payer: BC Managed Care – PPO | Admitting: Cardiology

## 2019-12-12 DIAGNOSIS — R0602 Shortness of breath: Secondary | ICD-10-CM | POA: Diagnosis not present

## 2019-12-12 DIAGNOSIS — R42 Dizziness and giddiness: Secondary | ICD-10-CM | POA: Diagnosis not present

## 2019-12-12 DIAGNOSIS — I959 Hypotension, unspecified: Secondary | ICD-10-CM | POA: Diagnosis not present

## 2019-12-12 DIAGNOSIS — R011 Cardiac murmur, unspecified: Secondary | ICD-10-CM | POA: Diagnosis not present

## 2019-12-14 ENCOUNTER — Other Ambulatory Visit (HOSPITAL_BASED_OUTPATIENT_CLINIC_OR_DEPARTMENT_OTHER): Payer: Self-pay

## 2019-12-14 DIAGNOSIS — R0683 Snoring: Secondary | ICD-10-CM

## 2019-12-14 DIAGNOSIS — R5383 Other fatigue: Secondary | ICD-10-CM

## 2019-12-14 DIAGNOSIS — R0602 Shortness of breath: Secondary | ICD-10-CM

## 2019-12-19 ENCOUNTER — Other Ambulatory Visit: Payer: Self-pay

## 2019-12-19 ENCOUNTER — Other Ambulatory Visit (HOSPITAL_COMMUNITY)
Admission: RE | Admit: 2019-12-19 | Discharge: 2019-12-19 | Disposition: A | Discharge status: Home / Self Care | Payer: BC Managed Care – PPO | Source: Ambulatory Visit | Attending: Internal Medicine | Admitting: Internal Medicine

## 2019-12-19 DIAGNOSIS — Z20822 Contact with and (suspected) exposure to covid-19: Secondary | ICD-10-CM | POA: Diagnosis not present

## 2019-12-19 DIAGNOSIS — Z01812 Encounter for preprocedural laboratory examination: Secondary | ICD-10-CM | POA: Insufficient documentation

## 2019-12-20 LAB — SARS CORONAVIRUS 2 (TAT 6-24 HRS): SARS Coronavirus 2: NEGATIVE

## 2019-12-21 ENCOUNTER — Emergency Department (HOSPITAL_COMMUNITY): Admission: EM | Admit: 2019-12-21 | Discharge: 2019-12-21 | Discharge status: Absent without leave | Payer: Self-pay

## 2019-12-21 ENCOUNTER — Other Ambulatory Visit: Payer: Self-pay

## 2019-12-21 ENCOUNTER — Ambulatory Visit: Payer: BC Managed Care – PPO | Attending: Internal Medicine | Admitting: Neurology

## 2019-12-21 DIAGNOSIS — R0683 Snoring: Secondary | ICD-10-CM

## 2019-12-21 DIAGNOSIS — R0602 Shortness of breath: Secondary | ICD-10-CM

## 2019-12-21 DIAGNOSIS — G4733 Obstructive sleep apnea (adult) (pediatric): Secondary | ICD-10-CM | POA: Diagnosis not present

## 2019-12-21 DIAGNOSIS — Z79899 Other long term (current) drug therapy: Secondary | ICD-10-CM | POA: Insufficient documentation

## 2019-12-21 DIAGNOSIS — R5383 Other fatigue: Secondary | ICD-10-CM

## 2019-12-21 DIAGNOSIS — R062 Wheezing: Secondary | ICD-10-CM | POA: Diagnosis not present

## 2019-12-23 NOTE — Procedures (Signed)
[place] A. Merlene Laughter, MD     [email or web address]             NOCTURNAL POLYSOMNOGRAPHY   LOCATION: [facility]  Patient Name: Lisa Garner, Lisa Garner Date: 12/21/2019 Gender: Female D.O.B: 10-05-1970 Age (years): 48 Referring Provider: Delphina Cahill Height (inches): 67 Interpreting Physician: Phillips Odor MD, ABSM Weight (lbs): 207 RPSGT: Peak, Robert BMI: 32 MRN: [id number] Neck Size: 16.00 CLINICAL INFORMATION Sleep Study Type: NPSG     Indication for sleep study: N/A     Epworth Sleepiness Score: 1     SLEEP STUDY TECHNIQUE As per the AASM Manual for the Scoring of Sleep and Associated Events v2.3 (April 2016) with a hypopnea requiring 4% desaturations.  The channels recorded and monitored were frontal, central and occipital EEG, electrooculogram (EOG), submentalis EMG (chin), nasal and oral airflow, thoracic and abdominal wall motion, anterior tibialis EMG, snore microphone, electrocardiogram, and pulse oximetry.  MEDICATIONS Medications self-administered by patient taken the night of the study : N/A  Current Outpatient Medications:  .  calcium carbonate (OS-CAL) 600 MG TABS, Take 600 mg by mouth 2 (two) times daily with a meal.  , Disp: , Rfl:  .  cholecalciferol (VITAMIN D) 1000 UNITS tablet, Take 2,000 Units by mouth daily. , Disp: , Rfl:  .  dicyclomine (BENTYL) 10 MG capsule, Take 1 capsule (10 mg total) by mouth 3 (three) times daily as needed for spasms., Disp: 90 capsule, Rfl: 1 .  ibuprofen (ADVIL) 800 MG tablet, Take 800 mg by mouth every 8 (eight) hours as needed., Disp: , Rfl:  .  levocetirizine (XYZAL) 5 MG tablet, Take 5 mg by mouth daily., Disp: , Rfl:  .  magnesium gluconate (MAGONATE) 500 MG tablet, Take 500 mg by mouth daily., Disp: , Rfl:  .  meloxicam (MOBIC) 15 MG tablet, Take 15 mg by mouth daily as needed for pain., Disp: , Rfl:  .  Multiple Vitamin (MULTIVITAMIN) capsule, Take 1 capsule by mouth daily.  , Disp: ,  Rfl:  .  pantoprazole (PROTONIX) 40 MG tablet, , Disp: , Rfl: 4 .  progesterone (PROMETRIUM) 200 MG capsule, Take 200 mg by mouth daily., Disp: , Rfl:  .  tiZANidine (ZANAFLEX) 4 MG capsule, Take 4 mg by mouth 3 (three) times daily as needed for muscle spasms., Disp: , Rfl:      SLEEP ARCHITECTURE The study was initiated at 9:46:19 PM and ended at 4:56:49 AM.  Sleep onset time was 43.9 minutes and the sleep efficiency was 79.7%%. The total sleep time was 343.1 minutes.  Stage REM latency was 118.0 minutes.  The patient spent 3.5%% of the night in stage N1 sleep, 56.9%% in stage N2 sleep, 20.8%% in stage N3 and 18.8% in REM.  Alpha intrusion was absent.  Supine sleep was 15.01%.  RESPIRATORY PARAMETERS The overall apnea/hypopnea index (AHI) was 3.1 per hour. There were 0 total apneas, including 0 obstructive, 0 central and 0 mixed apneas. There were 18 hypopneas and 1 RERAs.  The AHI during Stage REM sleep was 9.3 per hour.  AHI while supine was 11.7 per hour.  The mean oxygen saturation was 95.0%. The minimum SpO2 during sleep was 88.0%.  soft snoring was noted during this study.  CARDIAC DATA The 2 lead EKG demonstrated sinus rhythm. The mean heart rate was 71.5 beats per minute. Other EKG findings include: None. LEG MOVEMENT DATA The total PLMS were 0 with a resulting PLMS index of 0.0. Associated arousal with leg movement index  was 0.0.  IMPRESSIONS No significant obstructive sleep apnea occurred during this study. No significant central sleep apnea occurred during this study.    Delano Metz, MD Diplomate, American Board of Sleep Medicine. ELECTRONICALLY SIGNED ON:  12/23/2019, 1:31 PM [place] PH: [phone]   FX: [phone] [place]

## 2020-01-16 ENCOUNTER — Encounter: Payer: Self-pay | Admitting: Gastroenterology

## 2020-01-18 DIAGNOSIS — Z4789 Encounter for other orthopedic aftercare: Secondary | ICD-10-CM | POA: Diagnosis not present

## 2020-01-18 DIAGNOSIS — M25522 Pain in left elbow: Secondary | ICD-10-CM | POA: Diagnosis not present

## 2020-03-14 ENCOUNTER — Encounter: Payer: Self-pay | Admitting: Gastroenterology

## 2020-03-14 ENCOUNTER — Other Ambulatory Visit: Payer: Self-pay

## 2020-03-14 ENCOUNTER — Ambulatory Visit (INDEPENDENT_AMBULATORY_CARE_PROVIDER_SITE_OTHER): Payer: BC Managed Care – PPO | Admitting: Gastroenterology

## 2020-03-14 VITALS — BP 131/86 | HR 80 | Temp 97.6°F | Ht 64.0 in | Wt 210.8 lb

## 2020-03-14 DIAGNOSIS — R109 Unspecified abdominal pain: Secondary | ICD-10-CM

## 2020-03-14 DIAGNOSIS — Z01419 Encounter for gynecological examination (general) (routine) without abnormal findings: Secondary | ICD-10-CM | POA: Diagnosis not present

## 2020-03-14 DIAGNOSIS — K219 Gastro-esophageal reflux disease without esophagitis: Secondary | ICD-10-CM | POA: Diagnosis not present

## 2020-03-14 DIAGNOSIS — Z6835 Body mass index (BMI) 35.0-35.9, adult: Secondary | ICD-10-CM | POA: Diagnosis not present

## 2020-03-14 DIAGNOSIS — Z1231 Encounter for screening mammogram for malignant neoplasm of breast: Secondary | ICD-10-CM | POA: Diagnosis not present

## 2020-03-14 NOTE — Progress Notes (Signed)
Primary Care Physician:  Celene Squibb, MD Primary Gastroenterologist:  Previously Dr. Oneida Alar   Chief Complaint  Patient presents with  . Abdominal Pain    hurts in chest; pain "all over in stomach"; started year ago  . Nausea    no vomiting recently    HPI:   Lisa Garner is a 49 y.o. female presenting today with history of chronic GERD, IBS, remote H.pylori with EGD in 2016 negative for H.pylori and also documented eradication via stool antigen , last seen in 2016. Surveillance colonoscopy due in 2021. From review of chart, long-standing history of abdominal pain, with multiple imaging studies on file dating back to 2002. However, she has not had imaging since 2012. EGD and colonoscopy last in 2016.   Feels nauseated all the time. Concern for POTS per patient, but she has seen cardiology and not felt to have this. She did not follow-up with them and was recommended to lose weight and conservative therapy. She feels hot all the time. When standing BP drops and heart rate goes up. Sees GYN today. Nausea for months. Abdominal pain diffusely, sometimes intense in different areas. Constant pain. Gets hungry, eats, then feels worse. Nothing relieves pain. No significant reflux breakthrough. Taking once per day on an empty stomach in the morning. Mobic rarely. Takes Ibuprofen 800 mg for headaches. Takes at night if needed because it knocks her out. (800 mg).   Bowel movements not consistent. Unable to quantify if she has constipation or diarrhea. BM not daily. Sometimes skips days but not many. Denies consistent diarrhea. No hematochezia. No melena. No unexplained weight loss. Doesn't remember Bentyl helping. She has gained weight of about 13 lbs in the past year.   Father had celiac disease. Patient has been gluten free for 3 years now. Started to have symptoms several years ago with stomach cramping. Negative celiac serologies in remote past.   Last labs one year ago.     Past  Medical History:  Diagnosis Date  . Claudication [facility])    Lower arterial duplex scan 01/05/08 - Normal  . Congenital pulmonary stenosis    mild  . Fatty liver   . GERD (gastroesophageal reflux disease)   . Helicobacter pylori gastritis 2005  . IBS (irritable bowel syndrome)   . Pulmonary hypertension (HCC)    TTE 10/23/11 - LV size normal. LV EF 50-55%  . S/P endoscopy 05/25/08   Dr Maryclare Bean small bowel biopsy, reactive gastropathy  . Skin disorder     Past Surgical History:  Procedure Laterality Date  . CHOLECYSTECTOMY  1997   ERCP with sphincterotomy/stone extraction  . COLONOSCOPY N/A 01/18/2015   2 colon polyps, examined TI normal. Small internal hemorrhoids. Tubular adenoma  . DILATION AND CURETTAGE OF UTERUS    . ENDOMETRIAL ABLATION    . ESOPHAGOGASTRODUODENOSCOPY  05/25/2008   DZH:[id number] small bowel   . ESOPHAGOGASTRODUODENOSCOPY N/A 01/18/2015   multiple gastric polyps in gastric fundus and cardiac, mild non-erosive gastritis. No Hpylori   . KNEE SURGERY Left 02/1989  . TONSILLECTOMY    . WISDOM TOOTH EXTRACTION  11/1989    Current Outpatient Medications  Medication Sig Dispense Refill  . ascorbic acid (VITAMIN C) 500 MG tablet Take 500 mg by mouth daily.    . calcium carbonate (OS-CAL) 600 MG TABS Take 1,200 mg by mouth daily.     . cholecalciferol (VITAMIN D) 1000 UNITS tablet Take 2,000 Units by mouth daily.     Marland Kitchen  ibuprofen (ADVIL) 800 MG tablet Take 800 mg by mouth every 8 (eight) hours as needed.    Marland Kitchen levocetirizine (XYZAL) 5 MG tablet Take 5 mg by mouth daily.    . magnesium gluconate (MAGONATE) 500 MG tablet Take 500 mg by mouth daily.    . meloxicam (MOBIC) 15 MG tablet Take 15 mg by mouth daily as needed for pain.    . Multiple Vitamin (MULTIVITAMIN) capsule Take 1 capsule by mouth daily.      . pantoprazole (PROTONIX) 40 MG tablet Take 40 mg by mouth daily.   4  . progesterone (PROMETRIUM) 200 MG capsule Take 200 mg by mouth daily.    Marland Kitchen tiZANidine  (ZANAFLEX) 4 MG capsule Take 4 mg by mouth 3 (three) times daily as needed for muscle spasms.     No current facility-administered medications for this visit.    Allergies as of 03/14/2020 - Review Complete 03/14/2020  Allergen Reaction Noted  . Levbid [hyoscyamine sulfate] Rash 05/21/2011    Family History  Problem Relation Age of Onset  . Celiac disease Father        Lisa Garner  . Leukemia Father   . Colon polyps Mother   . Hyperlipidemia Mother   . Hyperlipidemia Sister   . Colon cancer Neg Hx     Social History   Socioeconomic History  . Marital status: Married    Spouse name: Not on file  . Number of children: 2  . Years of education: Not on file  . Highest education level: Not on file  Occupational History  . Occupation: Health and safety inspector  Tobacco Use  . Smoking status: Never Smoker  . Smokeless tobacco: Never Used  Substance and Sexual Activity  . Alcohol use: Yes    Alcohol/week: 0.0 standard drinks    Comment: occ  . Drug use: No  . Sexual activity: Yes    Partners: Male    Comment: No birth control  Other Topics Concern  . Not on file  Social History Narrative   Lives at home with husband.  He has two children.    Social Determinants of Health   Financial Resource Strain:   . Difficulty of Paying Living Expenses:   Food Insecurity:   . Worried About Charity fundraiser in the Last Year:   . Arboriculturist in the Last Year:   Transportation Garner:   . Film/video editor (Medical):   Marland Kitchen Lack of Transportation (Non-Medical):   Physical Activity:   . Days of Exercise per Week:   . Minutes of Exercise per Session:   Stress:   . Feeling of Stress :   Social Connections:   . Frequency of Communication with Friends and Family:   . Frequency of Social Gatherings with Friends and Family:   . Attends Religious Services:   . Active Member of Clubs or Organizations:   . Attends Archivist Meetings:   Marland Kitchen Marital Status:     Intimate Partner Violence:   . Fear of Current or Ex-Partner:   . Emotionally Abused:   Marland Kitchen Physically Abused:   . Sexually Abused:     Review of Systems: Gen: Denies any fever, chills, fatigue, weight loss, lack of appetite.  CV: see HPI Resp: Denies shortness of breath at rest or with exertion. Denies wheezing or cough.  GI: see HPI GU : Denies urinary burning, urinary frequency, urinary hesitancy MS: Denies joint pain, muscle weakness, cramps, or limitation of movement.  Derm: Denies rash, itching, dry skin Psych: Denies depression, anxiety, memory loss, and confusion Heme: Denies bruising, bleeding, and enlarged lymph nodes.  Physical Exam: BP 131/86   Pulse 80   Temp 97.6 F (36.4 C) (Oral)   Ht 5\' 4"  (1.626 m)   Wt 210 lb 12.8 oz (95.6 kg)   LMP 03/10/2020 (Approximate)   BMI 36.18 kg/m  General:   Alert and oriented. Pleasant and cooperative. Well-nourished and well-developed.  Head:  Normocephalic and atraumatic. Eyes:  Without icterus, sclera clear and conjunctiva pink.  Ears:  Normal auditory acuity. Mouth:  Mask in place Lungs:  Clear to auscultation bilaterally. No wheezes, rales, or rhonchi. No distress.  Heart:  S1, S2 present without obvious murmur Abdomen:  +BS, soft, TTP LLQ and epigastric with just light touch and non-distended. No HSM noted. No guarding or rebound. No masses appreciated.  Rectal:  Deferred  Msk:  Symmetrical without gross deformities. Normal posture. Extremities:  Without edema. Neurologic:  Alert and  oriented x4 Skin:  Intact without significant lesions or rashes. Psych:  Alert and cooperative. Normal mood and affect.  ASSESSMENT: RYLIEE FIGGE is a 49 y.o. female presenting today with long-standing history of abdominal pain dating back to at least early 2000s, H.pylori gastritis in remote past with documented eradication, gastritis, GERD, history of colon polyps with surveillance due now.   She reports recurrence of diffuse  abdominal pain at least the past year, now with nausea associated. Denying any relation to bowel habits, no obvious relieving factors. On physical exam, she has discomfort with just light touch and even notes how the pressure of her husband's hand on her is uncomfortable. With known gastritis and NSAID use, concern does remain for gastritis, possible PUD. Doubt any acute etiology or concerning etiology as this has been chronic an no alarm signs/symptoms. Highly suspect a chronic abdominal pain, functional abdominal pain. Interestingly, her father did have celiac disease, but she has had negative serologies and negative small bowel biopsies in the past; she is also following a strict gluten-free diet, so checking serologies now will likely not be helpful even if she does have a gluten sensitivity or celiac disease. Will pursue EGD with small bowel biopsies at time of colonoscopy.   Colonoscopy also due with personal history of polyps and mother with history of polyps. She has no concerning lower GI signs/symptoms. May have some underlying constipation, but it is difficult to sort out from her history. This could also be playing a role certainly.    PLAN:  Check CBC, CMP, TSH today  Consider updated imaging if any worsening  Add benefiber 2 teaspoons daily and increase as tolerated  Stop Protonix, start Dexilant samples. Patient to give progress report. If no improvement, will increase Protonix to BID  Proceed with TCS/EGD/small bowel biopsies with Dr. Scharlene Gloss in near future: the risks, benefits, and alternatives have been discussed with the patient in detail. The patient states understanding and desires to proceed.  Further recommendations to follow   Lisa Needs, PhD, ANP-BC [facility]

## 2020-03-14 NOTE — Patient Instructions (Signed)
Let's stop Protonix for now. Start Dexilant once daily. I have given you samples. Let me know how this works!  I recommend adding Benefiber or generic equivalent 2 teaspoons each morning up to 3 times per day. This is to help with bowel function.  We are arranging a colonoscopy, upper endoscopy, and small bowel biopsies in the near future.  Please have blood work done when you are able!  Further recommendations to follow!   It was a pleasure to see you today. I want to create trusting relationships with patients to provide genuine, compassionate, and quality care. I value your feedback. If you receive a survey regarding your visit,  I greatly appreciate you taking time to fill this out.   Annitta Needs, PhD, ANP-BC [facility]

## 2020-03-15 ENCOUNTER — Telehealth: Payer: Self-pay | Admitting: Internal Medicine

## 2020-03-15 DIAGNOSIS — R109 Unspecified abdominal pain: Secondary | ICD-10-CM

## 2020-03-15 NOTE — Telephone Encounter (Signed)
Pt was seen yesterday by AB and called today asking for her lab order on her thyroid to be changed to a complete thyroid check and she said she had to leave before being scheduled her colonoscopy yesterday and wanted to know when she could get that scheduled. I told her I would sent a phone note to the nurse and scheduler and someone would be in touch. [phone] (work number)

## 2020-03-18 ENCOUNTER — Telehealth: Payer: Self-pay

## 2020-03-18 NOTE — Telephone Encounter (Signed)
Tried to call pt to schedule TCS/EGD w/Prop w/Dr. Abbey Chatters (ASA 2), no answer, LMOVM for return call.

## 2020-03-18 NOTE — Telephone Encounter (Signed)
Are we able to change lab order?

## 2020-03-19 NOTE — Telephone Encounter (Signed)
Talked to pt yesterday afternoon, procedure scheduled for 04/19/20 at 12:30pm. COVID test 04/18/20. Orders entered. Appt letter mailed with procedure instructions.

## 2020-03-25 NOTE — Telephone Encounter (Signed)
Yes, that's fine. I put in the order for the thyroid profile

## 2020-03-25 NOTE — Addendum Note (Signed)
Addended by: Gordy Levan, Tanush Drees A on: 03/25/2020 08:21 PM   Modules accepted: Orders

## 2020-03-26 NOTE — Telephone Encounter (Signed)
Pt notified order for thyroid profile was sent to [facility]

## 2020-04-02 DIAGNOSIS — R109 Unspecified abdominal pain: Secondary | ICD-10-CM | POA: Diagnosis not present

## 2020-04-03 LAB — THYROID PANEL
Free Thyroxine Index: 1.8 (ref 1.2–4.9)
T3 Uptake Ratio: 25 % (ref 24–39)
T4, Total: 7.3 ug/dL (ref 4.5–12.0)

## 2020-04-04 ENCOUNTER — Other Ambulatory Visit: Payer: Self-pay

## 2020-04-04 ENCOUNTER — Encounter (HOSPITAL_COMMUNITY)
Admission: RE | Admit: 2020-04-04 | Discharge: 2020-04-04 | Disposition: A | Discharge status: Home / Self Care | Payer: BC Managed Care – PPO | Source: Ambulatory Visit | Attending: Internal Medicine | Admitting: Internal Medicine

## 2020-04-09 DIAGNOSIS — R109 Unspecified abdominal pain: Secondary | ICD-10-CM | POA: Diagnosis not present

## 2020-04-10 LAB — COMPLETE METABOLIC PANEL WITH GFR
AG Ratio: 1.8 (calc) (ref 1.0–2.5)
ALT: 13 U/L (ref 6–29)
AST: 19 U/L (ref 10–35)
Albumin: 4.2 g/dL (ref 3.6–5.1)
Alkaline phosphatase (APISO): 64 U/L (ref 31–125)
BUN: 12 mg/dL (ref 7–25)
CO2: 25 mmol/L (ref 20–32)
Calcium: 9 mg/dL (ref 8.6–10.2)
Chloride: 104 mmol/L (ref 98–110)
Creat: 0.94 mg/dL (ref 0.50–1.10)
GFR, Est African American: 84 mL/min/{1.73_m2} (ref >=60)
GFR, Est Non African American: 72 mL/min/{1.73_m2} (ref >=60)
Globulin: 2.3 g/dL (calc) (ref 1.9–3.7)
Glucose, Bld: 90 mg/dL (ref 65–99)
Potassium: 4.3 mmol/L (ref 3.5–5.3)
Sodium: 140 mmol/L (ref 135–146)
Total Bilirubin: 0.3 mg/dL (ref 0.2–1.2)
Total Protein: 6.5 g/dL (ref 6.1–8.1)

## 2020-04-10 LAB — CBC WITH DIFFERENTIAL/PLATELET
Absolute Monocytes: 545 cells/uL (ref 200–950)
Basophils Absolute: 40 cells/uL (ref 0–200)
Basophils Relative: 0.4 %
Eosinophils Absolute: 172 cells/uL (ref 15–500)
Eosinophils Relative: 1.7 %
HCT: 40.7 % (ref 35.0–45.0)
Hemoglobin: 13.7 g/dL (ref 11.7–15.5)
Lymphs Abs: 2091 cells/uL (ref 850–3900)
MCH: 30 pg (ref 27.0–33.0)
MCHC: 33.7 g/dL (ref 32.0–36.0)
MCV: 89.3 fL (ref 80.0–100.0)
MPV: 10.7 fL (ref 7.5–12.5)
Monocytes Relative: 5.4 %
Neutro Abs: 7252 cells/uL (ref 1500–7800)
Neutrophils Relative %: 71.8 %
Platelets: 316 10*3/uL (ref 140–400)
RBC: 4.56 10*6/uL (ref 3.80–5.10)
RDW: 13.1 % (ref 11.0–15.0)
Total Lymphocyte: 20.7 %
WBC: 10.1 10*3/uL (ref 3.8–10.8)

## 2020-04-10 LAB — ADVANCED WRITTEN NOTIFICATION (AWN) TEST REFUSAL

## 2020-04-18 ENCOUNTER — Other Ambulatory Visit (HOSPITAL_COMMUNITY)
Admission: RE | Admit: 2020-04-18 | Discharge: 2020-04-18 | Disposition: A | Discharge status: Home / Self Care | Payer: BC Managed Care – PPO | Source: Ambulatory Visit | Attending: Internal Medicine | Admitting: Internal Medicine

## 2020-04-18 ENCOUNTER — Other Ambulatory Visit: Payer: Self-pay

## 2020-04-18 DIAGNOSIS — Z01812 Encounter for preprocedural laboratory examination: Secondary | ICD-10-CM | POA: Diagnosis not present

## 2020-04-18 DIAGNOSIS — Z20822 Contact with and (suspected) exposure to covid-19: Secondary | ICD-10-CM | POA: Insufficient documentation

## 2020-04-18 LAB — SARS CORONAVIRUS 2 (TAT 6-24 HRS): SARS Coronavirus 2: NEGATIVE

## 2020-04-19 ENCOUNTER — Encounter (HOSPITAL_COMMUNITY): Payer: Self-pay

## 2020-04-19 ENCOUNTER — Other Ambulatory Visit: Payer: Self-pay

## 2020-04-19 ENCOUNTER — Ambulatory Visit (HOSPITAL_COMMUNITY): Payer: BC Managed Care – PPO | Admitting: Anesthesiology

## 2020-04-19 ENCOUNTER — Encounter (HOSPITAL_COMMUNITY)
Admission: RE | Disposition: A | Discharge status: Home / Self Care | Payer: Self-pay | Source: Home / Self Care | Attending: Internal Medicine

## 2020-04-19 ENCOUNTER — Ambulatory Visit (HOSPITAL_COMMUNITY)
Admission: RE | Admit: 2020-04-19 | Discharge: 2020-04-19 | Disposition: A | Discharge status: Home / Self Care | Payer: BC Managed Care – PPO | Attending: Internal Medicine | Admitting: Internal Medicine

## 2020-04-19 DIAGNOSIS — K319 Disease of stomach and duodenum, unspecified: Secondary | ICD-10-CM | POA: Diagnosis not present

## 2020-04-19 DIAGNOSIS — Z1211 Encounter for screening for malignant neoplasm of colon: Secondary | ICD-10-CM | POA: Insufficient documentation

## 2020-04-19 DIAGNOSIS — K648 Other hemorrhoids: Secondary | ICD-10-CM | POA: Diagnosis not present

## 2020-04-19 DIAGNOSIS — I272 Pulmonary hypertension, unspecified: Secondary | ICD-10-CM | POA: Diagnosis not present

## 2020-04-19 DIAGNOSIS — R1013 Epigastric pain: Secondary | ICD-10-CM | POA: Insufficient documentation

## 2020-04-19 DIAGNOSIS — R12 Heartburn: Secondary | ICD-10-CM | POA: Diagnosis not present

## 2020-04-19 DIAGNOSIS — Q256 Stenosis of pulmonary artery: Secondary | ICD-10-CM | POA: Diagnosis not present

## 2020-04-19 DIAGNOSIS — R109 Unspecified abdominal pain: Secondary | ICD-10-CM | POA: Diagnosis not present

## 2020-04-19 DIAGNOSIS — K317 Polyp of stomach and duodenum: Secondary | ICD-10-CM | POA: Diagnosis not present

## 2020-04-19 DIAGNOSIS — K3189 Other diseases of stomach and duodenum: Secondary | ICD-10-CM | POA: Diagnosis not present

## 2020-04-19 DIAGNOSIS — K2970 Gastritis, unspecified, without bleeding: Secondary | ICD-10-CM | POA: Diagnosis not present

## 2020-04-19 DIAGNOSIS — Z86010 Personal history of colonic polyps: Secondary | ICD-10-CM | POA: Insufficient documentation

## 2020-04-19 DIAGNOSIS — K219 Gastro-esophageal reflux disease without esophagitis: Secondary | ICD-10-CM | POA: Diagnosis not present

## 2020-04-19 DIAGNOSIS — Z79899 Other long term (current) drug therapy: Secondary | ICD-10-CM | POA: Diagnosis not present

## 2020-04-19 HISTORY — PX: BIOPSY: SHX5522

## 2020-04-19 HISTORY — PX: COLONOSCOPY WITH PROPOFOL: SHX5780

## 2020-04-19 HISTORY — PX: ESOPHAGOGASTRODUODENOSCOPY (EGD) WITH PROPOFOL: SHX5813

## 2020-04-19 LAB — PREGNANCY, URINE: Preg Test, Ur: NEGATIVE

## 2020-04-19 SURGERY — COLONOSCOPY WITH PROPOFOL
Anesthesia: General

## 2020-04-19 MED ORDER — CHLORHEXIDINE GLUCONATE CLOTH 2 % EX PADS: 6.0000 | MEDICATED_PAD | Freq: Once | CUTANEOUS | Status: DC

## 2020-04-19 MED ORDER — GLYCOPYRROLATE 0.2 MG/ML IJ SOLN
INTRAMUSCULAR | Status: AC
  Filled 2020-04-19: qty 1

## 2020-04-19 MED ORDER — PROPOFOL 10 MG/ML IV BOLUS
INTRAVENOUS | Status: DC | PRN
  Administered 2020-04-19: 100 mg via INTRAVENOUS
  Administered 2020-04-19: 50 mg via INTRAVENOUS
  Administered 2020-04-19: 175 ug/kg/min via INTRAVENOUS

## 2020-04-19 MED ORDER — LACTATED RINGERS IV SOLN
Freq: Once | INTRAVENOUS | Status: AC
  Administered 2020-04-19: 1000 mL via INTRAVENOUS

## 2020-04-19 MED ORDER — LIDOCAINE VISCOUS HCL 2 % MT SOLN
OROMUCOSAL | Status: AC
  Filled 2020-04-19: qty 15

## 2020-04-19 MED ORDER — LACTATED RINGERS IV SOLN: INTRAVENOUS | Status: DC | PRN

## 2020-04-19 MED ORDER — GLYCOPYRROLATE 0.2 MG/ML IJ SOLN
0.2000 mg | Freq: Once | INTRAMUSCULAR | Status: AC
  Administered 2020-04-19: 0.2 mg via INTRAVENOUS

## 2020-04-19 MED ORDER — LIDOCAINE VISCOUS HCL 2 % MT SOLN
15.0000 mL | Freq: Once | OROMUCOSAL | Status: AC
  Administered 2020-04-19: 15 mL via OROMUCOSAL

## 2020-04-19 MED ORDER — LIDOCAINE HCL (CARDIAC) PF 50 MG/5ML IV SOSY
PREFILLED_SYRINGE | INTRAVENOUS | Status: DC | PRN
  Administered 2020-04-19: 100 mg via INTRAVENOUS

## 2020-04-19 MED ORDER — STERILE WATER FOR IRRIGATION IR SOLN
Status: DC | PRN
  Administered 2020-04-19: 1.5 mL

## 2020-04-19 NOTE — Op Note (Signed)
[facility] Patient Name: Lisa Garner Procedure Date: 04/19/2020 11:07 AM MRN: [id number] Date of Birth: [birth year] Attending MD: Elon Alas. Abbey Chatters DO CSN: [id number] Age: 49 Admit Type: Outpatient Procedure:                Upper GI endoscopy Indications:              Epigastric abdominal pain, Heartburn Providers:                Elon Alas. Kenzie Thoreson, DO, Otis Peak B. Sharon Seller, RN,                            Caprice Kluver, Nelma Rothman, Technician Referring MD:              Medicines:                See the Anesthesia note for documentation of the                            administered medications Complications:            No immediate complications. Estimated Blood Loss:     Estimated blood loss was minimal. Procedure:                Pre-Anesthesia Assessment:                           - The anesthesia plan was to use monitored                            anesthesia care (MAC).                           After obtaining informed consent, the endoscope was                            passed under direct vision. Throughout the                            procedure, the patient's blood pressure, pulse, and                            oxygen saturations were monitored continuously. The                            [id number]-[id number] ([id number]) scope was introduced through the                            mouth, and advanced to the second part of duodenum.                            The upper GI endoscopy was accomplished without                            difficulty. The patient tolerated the procedure  well. Scope In: 11:12:23 AM Scope Out: 11:15:57 AM Total Procedure Duration: 0 hours 3 minutes 34 seconds  Findings:      The Z-line was regular and was found 36 cm from the incisors.      Diffuse mild inflammation characterized by erythema was found in the       entire examined stomach. Biopsies were taken with a cold forceps for       Helicobacter pylori testing.       Multiple pedunculated and sessile polyps with no bleeding and no       stigmata of recent bleeding were found in the gastric fundus.      The duodenal bulb, first portion of the duodenum and second portion of       the duodenum were normal. Biopsies for histology were taken with a cold       forceps for evaluation of celiac disease. Impression:               - Z-line regular, 36 cm from the incisors.                           - Gastritis. Biopsied.                           - Multiple gastric polyps.                           - Normal duodenal bulb, first portion of the                            duodenum and second portion of the duodenum.                            Biopsied. Moderate Sedation:      Per Anesthesia Care Recommendation:           - Patient has a contact number available for                            emergencies. The signs and symptoms of potential                            delayed complications were discussed with the                            patient. Return to normal activities tomorrow.                            Written discharge instructions were provided to the                            patient.                           - Resume previous diet.                           - Continue present medications.                           -  Await pathology results.                           - Return to GI clinic with Roseanne Kaufman in 8 weeks. Procedure Code(s):        --- Professional ---                           [phone], Esophagogastroduodenoscopy, flexible,                            transoral; with biopsy, single or multiple Diagnosis Code(s):        --- Professional ---                           K29.70, Gastritis, unspecified, without bleeding                           K31.7, Polyp of stomach and duodenum                           R10.13, Epigastric pain                           R12, Heartburn CPT copyright 2019 American Medical Association. All rights reserved. The  codes documented in this report are preliminary and upon coder review may  be revised to meet current compliance requirements. Elon Alas. Abbey Chatters, [place] Samyiah Halvorsen, DO 04/19/2020 11:19:31 AM This report has been signed electronically. Number of Addenda: 0

## 2020-04-19 NOTE — Anesthesia Postprocedure Evaluation (Signed)
Anesthesia Post Note  Patient: BRIT CARBONELL  Procedure(s) Performed: COLONOSCOPY WITH PROPOFOL (N/A ) ESOPHAGOGASTRODUODENOSCOPY (EGD) WITH PROPOFOL (N/A ) BIOPSY  Patient location during evaluation: Endoscopy Anesthesia Type: General Level of consciousness: awake, oriented, awake and alert and patient cooperative Pain management: pain level controlled Vital Signs Assessment: post-procedure vital signs reviewed and stable Respiratory status: spontaneous breathing, respiratory function stable and nonlabored ventilation Cardiovascular status: blood pressure returned to baseline and stable Postop Assessment: no backache and no headache Anesthetic complications: no   No complications documented.   Last Vitals:  Vitals:   04/19/20 1039  BP: 134/81  Pulse: 73  Resp: 10  Temp: 36.6 C  SpO2: 98%    Last Pain:  Vitals:   04/19/20 1109  TempSrc:   PainSc: 0-No pain                 Tacy Learn

## 2020-04-19 NOTE — Op Note (Signed)
[facility] Patient Name: Lisa Garner Procedure Date: 04/19/2020 11:16 AM MRN: [id number] Date of Birth: 07-Jun-1971 Attending MD: Elon Alas. Abbey Chatters DO CSN: [id number] Age: 49 Admit Type: Outpatient Procedure:                Colonoscopy Indications:              High risk colon cancer surveillance: Personal                            history of colonic polyps Providers:                Elon Alas. Artemisa Sladek, DO, Otis Peak B. Sharon Seller, RN,                            Caprice Kluver, Nelma Rothman, Technician Referring MD:              Medicines:                See the Anesthesia note for documentation of the                            administered medications Complications:            No immediate complications. Estimated Blood Loss:     Estimated blood loss: none. Procedure:                Pre-Anesthesia Assessment:                           - The anesthesia plan was to use monitored                            anesthesia care (MAC).                           After obtaining informed consent, the colonoscope                            was passed under direct vision. Throughout the                            procedure, the patient's blood pressure, pulse, and                            oxygen saturations were monitored continuously. The                            [id number]-[id number] ([id number]) scope was introduced through                            the anus and advanced to the the terminal ileum,                            with identification of the appendiceal orifice and                            IC valve. The colonoscopy  was performed without                            difficulty. The patient tolerated the procedure                            well. The quality of the bowel preparation was                            evaluated using the BBPS [facility] Bowel Preparation                            Scale) with scores of: Right Colon = 2 (minor                            amount of residual staining,  small fragments of                            stool and/or opaque liquid, but mucosa seen well),                            Transverse Colon = 2 (minor amount of residual                            staining, small fragments of stool and/or opaque                            liquid, but mucosa seen well) and Left Colon = 2                            (minor amount of residual staining, small fragments                            of stool and/or opaque liquid, but mucosa seen                            well). The total BBPS score equals 6. The quality                            of the bowel preparation was fair. Scope In: 11:20:55 AM Scope Out: 11:32:42 AM Scope Withdrawal Time: 0 hours 6 minutes 51 seconds  Total Procedure Duration: 0 hours 11 minutes 47 seconds  Findings:      The perianal and digital rectal examinations were normal.      Non-bleeding internal hemorrhoids were found during endoscopy.      The terminal ileum appeared normal.      The exam was otherwise without abnormality. Impression:               - Preparation of the colon was fair.                           - Non-bleeding internal hemorrhoids.                           -  The examined portion of the ileum was normal.                           - The examination was otherwise normal.                           - No specimens collected. Moderate Sedation:      Per Anesthesia Care Recommendation:           - Patient has a contact number available for                            emergencies. The signs and symptoms of potential                            delayed complications were discussed with the                            patient. Return to normal activities tomorrow.                            Written discharge instructions were provided to the                            patient.                           - Resume previous diet.                           - Continue present medications.                           - Repeat  colonoscopy in 7 years for surveillance. Procedure Code(s):        --- Professional ---                           [id number], Colonoscopy, flexible; diagnostic, including                            collection of specimen(s) by brushing or washing,                            when performed (separate procedure) Diagnosis Code(s):        --- Professional ---                           Z86.010, Personal history of colonic polyps                           K64.8, Other hemorrhoids CPT copyright 2019 American Medical Association. All rights reserved. The codes documented in this report are preliminary and upon coder review may  be revised to meet current compliance requirements. Elon Alas. Abbey Chatters, [place] Abbey Chatters, DO 04/19/2020 11:37:32 AM This report has been signed electronically. Number of Addenda: 0

## 2020-04-19 NOTE — H&P (Signed)
Primary Care Physician:  Celene Squibb, MD Primary Gastroenterologist:  Dr. Abbey Chatters  Pre-Procedure History & Physical: HPI:  Lisa Garner is a 49 y.o. female is here for a EHD and colonoscopy due to  history of polyps as well as GERD and abdominal pain .   Past Medical History:  Diagnosis Date  . Claudication [facility])    Lower arterial duplex scan 01/05/08 - Normal  . Congenital pulmonary stenosis    mild  . Fatty liver   . GERD (gastroesophageal reflux disease)   . Helicobacter pylori gastritis 2005  . IBS (irritable bowel syndrome)   . Pulmonary hypertension (HCC)    TTE 10/23/11 - LV size normal. LV EF 50-55%  . S/P endoscopy 05/25/08   Dr Maryclare Bean small bowel biopsy, reactive gastropathy  . Skin disorder     Past Surgical History:  Procedure Laterality Date  . arm surgery    . CHOLECYSTECTOMY  1997   ERCP with sphincterotomy/stone extraction  . COLONOSCOPY N/A 01/18/2015   2 colon polyps, examined TI normal. Small internal hemorrhoids. Tubular adenoma  . DILATION AND CURETTAGE OF UTERUS    . ENDOMETRIAL ABLATION    . ESOPHAGOGASTRODUODENOSCOPY  05/25/2008   MPN:TIRWER small bowel   . ESOPHAGOGASTRODUODENOSCOPY N/A 01/18/2015   multiple gastric polyps in gastric fundus and cardiac, mild non-erosive gastritis. No Hpylori   . KNEE SURGERY Left 02/1989  . TONSILLECTOMY    . WISDOM TOOTH EXTRACTION  11/1989    Prior to Admission medications   Medication Sig Start Date End Date Taking? Authorizing Provider  ascorbic acid (VITAMIN C) 500 MG tablet Take 500 mg by mouth daily.   Yes [provider]  Biotin w/ Vitamins C & E (HAIR/SKIN/NAILS PO) Take 3 tablets by mouth daily.   Yes [provider]  Calcium Citrate-Vitamin D (CALCIUM + D PO) Take 2 tablets by mouth daily.   Yes [provider]  Cholecalciferol (DIALYVITE VITAMIN D 5000) 125 MCG (5000 UT) capsule Take 5,000 Units by mouth daily.   Yes [provider]  dexlansoprazole  (DEXILANT) 60 MG capsule Take 60 mg by mouth daily.   Yes [provider]  ibuprofen (ADVIL) 800 MG tablet Take 800 mg by mouth at bedtime as needed for moderate pain.    Yes [provider]  levocetirizine (XYZAL) 5 MG tablet Take 5 mg by mouth daily. 11/29/18  Yes [provider]  magnesium gluconate (MAGONATE) 500 MG tablet Take 500 mg by mouth daily.   Yes [provider]  progesterone (PROMETRIUM) 200 MG capsule Take 200 mg by mouth daily. 01/18/19  Yes [provider]  meloxicam (MOBIC) 15 MG tablet Take 15 mg by mouth daily as needed for pain.    [provider]  pantoprazole (PROTONIX) 40 MG tablet Take 40 mg by mouth daily.  12/10/14   [provider]    Allergies as of 03/19/2020 - Review Complete 03/14/2020  Allergen Reaction Noted  . Levbid [hyoscyamine sulfate] Rash 05/21/2011    Family History  Problem Relation Age of Onset  . Celiac disease Father        Neill Loft  . Leukemia Father   . Colon polyps Mother   . Hyperlipidemia Mother   . Hyperlipidemia Sister   . Colon cancer Neg Hx     Social History   Socioeconomic History  . Marital status: Married    Spouse name: Not on file  . Number of children: 2  . Years of  education: Not on file  . Highest education level: Not on file  Occupational History  . Occupation: Health and safety inspector  Tobacco Use  . Smoking status: Never Smoker  . Smokeless tobacco: Never Used  Substance and Sexual Activity  . Alcohol use: Yes    Alcohol/week: 0.0 standard drinks    Comment: occ  . Drug use: No  . Sexual activity: Yes    Partners: Male    Comment: No birth control  Other Topics Concern  . Not on file  Social History Narrative   Lives at home with husband.  He has two children.    Social Determinants of Health   Financial Resource Strain:   . Difficulty of Paying Living Expenses: Not on file  Food Insecurity:   . Worried About Sales executive in the Last Year: Not on file  . Ran Out of Food in the Last Year: Not on file  Transportation Needs:   . Lack of Transportation (Medical): Not on file  . Lack of Transportation (Non-Medical): Not on file  Physical Activity:   . Days of Exercise per Week: Not on file  . Minutes of Exercise per Session: Not on file  Stress:   . Feeling of Stress : Not on file  Social Connections:   . Frequency of Communication with Friends and Family: Not on file  . Frequency of Social Gatherings with Friends and Family: Not on file  . Attends Religious Services: Not on file  . Active Member of Clubs or Organizations: Not on file  . Attends Archivist Meetings: Not on file  . Marital Status: Not on file  Intimate Partner Violence:   . Fear of Current or Ex-Partner: Not on file  . Emotionally Abused: Not on file  . Physically Abused: Not on file  . Sexually Abused: Not on file    Review of Systems: See HPI, otherwise negative ROS  Impression/Plan: Lisa Garner is here for an EGD and colonoscopy to be performed for history of polyps as well as GERD and abdominal pain  Risks, benefits, limitations, imponderables and alternatives regarding colonoscopy have been reviewed with the patient. Questions have been answered. All parties agreeable.

## 2020-04-19 NOTE — Discharge Instructions (Signed)
EGD Discharge instructions Please read the instructions outlined below and refer to this sheet in the next few weeks. These discharge instructions provide you with general information on caring for yourself after you leave the hospital. Your doctor may also give you specific instructions. While your treatment has been planned according to the most current medical practices available, unavoidable complications occasionally occur. If you have any problems or questions after discharge, please call your doctor. ACTIVITY  You may resume your regular activity but move at a slower pace for the next 24 hours.   Take frequent rest periods for the next 24 hours.   Walking will help expel (get rid of) the air and reduce the bloated feeling in your abdomen.   No driving for 24 hours (because of the anesthesia (medicine) used during the test).   You may shower.   Do not sign any important legal documents or operate any machinery for 24 hours (because of the anesthesia used during the test).  NUTRITION  Drink plenty of fluids.   You may resume your normal diet.   Begin with a light meal and progress to your normal diet.   Avoid alcoholic beverages for 24 hours or as instructed by your caregiver.  MEDICATIONS  You may resume your normal medications unless your caregiver tells you otherwise.  WHAT YOU CAN EXPECT TODAY  You may experience abdominal discomfort such as a feeling of fullness or "gas" pains.  FOLLOW-UP  Your doctor will discuss the results of your test with you.  SEEK IMMEDIATE MEDICAL ATTENTION IF ANY OF THE FOLLOWING OCCUR:  Excessive nausea (feeling sick to your stomach) and/or vomiting.   Severe abdominal pain and distention (swelling).   Trouble swallowing.   Temperature over 101 F (37.8 C).   Rectal bleeding or vomiting of blood.    Colonoscopy Discharge Instructions  Read the instructions outlined below and refer to this sheet in the next few weeks. These  discharge instructions provide you with general information on caring for yourself after you leave the hospital. Your doctor may also give you specific instructions. While your treatment has been planned according to the most current medical practices available, unavoidable complications occasionally occur.   ACTIVITY  You may resume your regular activity, but move at a slower pace for the next 24 hours.   Take frequent rest periods for the next 24 hours.   Walking will help get rid of the air and reduce the bloated feeling in your belly (abdomen).   No driving for 24 hours (because of the medicine (anesthesia) used during the test).    Do not sign any important legal documents or operate any machinery for 24 hours (because of the anesthesia used during the test).  NUTRITION  Drink plenty of fluids.   You may resume your normal diet as instructed by your doctor.   Begin with a light meal and progress to your normal diet. Heavy or fried foods are harder to digest and may make you feel sick to your stomach (nauseated).   Avoid alcoholic beverages for 24 hours or as instructed.  MEDICATIONS  You may resume your normal medications unless your doctor tells you otherwise.  WHAT YOU CAN EXPECT TODAY  Some feelings of bloating in the abdomen.   Passage of more gas than usual.   Spotting of blood in your stool or on the toilet paper.  IF YOU HAD POLYPS REMOVED DURING THE COLONOSCOPY:  No aspirin products for 7 days or as instructed.  No alcohol for 7 days or as instructed.   Eat a soft diet for the next 24 hours.  FINDING OUT THE RESULTS OF YOUR TEST Not all test results are available during your visit. If your test results are not back during the visit, make an appointment with your caregiver to find out the results. Do not assume everything is normal if you have not heard from your caregiver or the medical facility. It is important for you to follow up on all of your test results.    SEEK IMMEDIATE MEDICAL ATTENTION IF:  You have more than a spotting of blood in your stool.   Your belly is swollen (abdominal distention).   You are nauseated or vomiting.   You have a temperature over 101.   You have abdominal pain or discomfort that is severe or gets worse throughout the day.   Your EGD showed inflammation in the stomach.  I took biopsies of your stomach to rule out bacterial infection with H. pylori.  Also took small bowel biopsies to rule out celiac sprue.  I would continue on omeprazole.  Your colonoscopy showed no polyps.  You did have small internal hemorrhoids which is common after colon preparation.  Based on the history of polyps, I recommend we repeat this in 7 years.  Follow-up with Roseanne Kaufman as previously scheduled.  I hope you have a great rest of your week!

## 2020-04-19 NOTE — Transfer of Care (Signed)
Immediate Anesthesia Transfer of Care Note  Patient: Lisa Garner  Procedure(s) Performed: COLONOSCOPY WITH PROPOFOL (N/A ) ESOPHAGOGASTRODUODENOSCOPY (EGD) WITH PROPOFOL (N/A ) BIOPSY  Patient Location: Endoscopy Unit  Anesthesia Type:General  Level of Consciousness: awake, alert , oriented and patient cooperative  Airway & Oxygen Therapy: Patient Spontanous Breathing  Post-op Assessment: Report given to RN, Post -op Vital signs reviewed and stable and Patient moving all extremities  Post vital signs: Reviewed and stable  Last Vitals:  Vitals Value Taken Time  BP    Temp    Pulse    Resp    SpO2      Last Pain:  Vitals:   04/19/20 1109  TempSrc:   PainSc: 0-No pain      Patients Stated Pain Goal: 10 (14/38/88 7579)  Complications: No complications documented.

## 2020-04-19 NOTE — Anesthesia Preprocedure Evaluation (Addendum)
Anesthesia Evaluation  Patient identified by MRN, date of birth, ID band Patient awake    Reviewed: Allergy & Precautions, NPO status , Patient's Chart, lab work & pertinent test results  History of Anesthesia Complications Negative for: history of anesthetic complications  Airway Mallampati: II  TM Distance: >3 FB Neck ROM: Full    Dental  (+) Dental Advisory Given, Teeth Intact   Pulmonary neg pulmonary ROS,    Pulmonary exam normal breath sounds clear to auscultation       Cardiovascular Exercise Tolerance: Good Normal cardiovascular exam Rhythm:Regular Rate:Normal  1. The left ventricle has normal systolic function, with an ejection  fraction of 55-60%. The cavity size was normal. Left ventricular diastolic  Doppler parameters are consistent with impaired relaxation. No evidence of  left ventricular regional wall  motion abnormalities.  2. The right ventricle has normal systolic function. The cavity was  normal. There is no increase in right ventricular wall thickness. Right  ventricular systolic pressure is normal with an estimated pressure of 29.6  mmHg.  3. The aorta is normal unless otherwise noted.   H/O Pulmonary stenosis, asymptomatic, good exercise tolerance    Neuro/Psych negative neurological ROS  negative psych ROS   GI/Hepatic Neg liver ROS, GERD  Medicated,  Endo/Other  negative endocrine ROS  Renal/GU negative Renal ROS     Musculoskeletal negative musculoskeletal ROS (+)   Abdominal   Peds  Hematology negative hematology ROS (+)   Anesthesia Other Findings   Reproductive/Obstetrics negative OB ROS                           Anesthesia Physical Anesthesia Plan  ASA: II  Anesthesia Plan: General   Post-op Pain Management:    Induction:   PONV Risk Score and Plan: TIVA  Airway Management Planned: Nasal Cannula, Natural Airway and Simple Face  Mask  Additional Equipment:   Intra-op Plan:   Post-operative Plan:   Informed Consent:     Dental advisory given  Plan Discussed with: CRNA and Surgeon  Anesthesia Plan Comments:        Anesthesia Quick Evaluation

## 2020-04-22 ENCOUNTER — Other Ambulatory Visit: Payer: Self-pay

## 2020-04-22 LAB — SURGICAL PATHOLOGY

## 2020-04-23 ENCOUNTER — Encounter (HOSPITAL_COMMUNITY): Payer: Self-pay | Admitting: Internal Medicine

## 2020-04-24 NOTE — Interval H&P Note (Signed)
History and Physical Interval Note:  04/24/2020 12:02 PM  Lisa Garner  has presented today for surgery, with the diagnosis of abdominal pain, history of polyps.  The various methods of treatment have been discussed with the patient and family. After consideration of risks, benefits and other options for treatment, the patient has consented to  Procedure(s) with comments: COLONOSCOPY WITH PROPOFOL (N/A) - 12:30pm ESOPHAGOGASTRODUODENOSCOPY (EGD) WITH PROPOFOL (N/A) BIOPSY - duodenal as a surgical intervention.  The patient's history has been reviewed, patient examined, no change in status, stable for surgery.  I have reviewed the patient's chart and labs.  Questions were answered to the patient's satisfaction.     [name]

## 2020-05-08 ENCOUNTER — Other Ambulatory Visit: Payer: Self-pay | Admitting: Gastroenterology

## 2020-05-08 MED ORDER — DEXILANT 60 MG PO CPDR: 60.0000 mg | DELAYED_RELEASE_CAPSULE | Freq: Every day | ORAL | 3 refills | Status: DC

## 2020-06-27 ENCOUNTER — Ambulatory Visit (INDEPENDENT_AMBULATORY_CARE_PROVIDER_SITE_OTHER): Payer: BC Managed Care – PPO | Admitting: Gastroenterology

## 2020-06-27 ENCOUNTER — Other Ambulatory Visit: Payer: Self-pay

## 2020-06-27 ENCOUNTER — Encounter: Payer: Self-pay | Admitting: Gastroenterology

## 2020-06-27 VITALS — BP 136/81 | HR 87 | Temp 97.0°F | Ht 64.0 in | Wt 212.0 lb

## 2020-06-27 DIAGNOSIS — R1084 Generalized abdominal pain: Secondary | ICD-10-CM

## 2020-06-27 NOTE — Patient Instructions (Signed)
We are arranging a CT scan in the near future.  Further recommendations to follow thereafter!   I enjoyed seeing you again today! As you know, I value our relationship and want to provide genuine, compassionate, and quality care. I welcome your feedback. If you receive a survey regarding your visit,  I greatly appreciate you taking time to fill this out. See you next time!  Annitta Needs, PhD, ANP-BC [facility]

## 2020-06-27 NOTE — Progress Notes (Signed)
Referring Provider: Celene Squibb, MD Primary Care Physician:  Celene Squibb, MD Primary GI: Dr. Abbey Chatters  Chief Complaint  Patient presents with   Abdominal Pain    still comes/goess all over, no worse, no better   HPI:   Lisa Garner is a 49 y.o. female presenting today with a history of chronic GERD, IBS, long-standing history of abdominal pain, following up after EGD and colonoscopy. Gastritis on EGD, negative duodenal biopsies for celiac disease. Colonoscopy unimpressive with next in 2028.   No weight loss. Dexilant too expensive so back on Protonix. Still with abdominal pain almost daily. Last night husband had hand on her abdomen and that pressure caused discomfort. Feels like a fullness, nausea feeling. Can last all day. Nothing really relieves it. Trends towards more constipation but alternates back and forth. Not associated with pain. Hasn't started Benefiber yet.   Past Medical History:  Diagnosis Date   Claudication [facility])    Lower arterial duplex scan 01/05/08 - Normal   Congenital pulmonary stenosis    mild   Fatty liver    GERD (gastroesophageal reflux disease)    Helicobacter pylori gastritis 2005   IBS (irritable bowel syndrome)    Pulmonary hypertension (HCC)    TTE 10/23/11 - LV size normal. LV EF 50-55%   S/P endoscopy 05/25/08   Dr Maryclare Bean small bowel biopsy, reactive gastropathy   Skin disorder     Past Surgical History:  Procedure Laterality Date   arm surgery     BIOPSY  04/19/2020   Procedure: BIOPSY;  Surgeon: [name], DO;  [facility];  Service: Endoscopy;;  duodenal   CHOLECYSTECTOMY  1997   ERCP with sphincterotomy/stone extraction   COLONOSCOPY N/A 01/18/2015   2 colon polyps, examined TI normal. Small internal hemorrhoids. Tubular adenoma   COLONOSCOPY WITH PROPOFOL N/A 04/19/2020   Non-bleeding internal hemorrhoids, TI normal. 7 years surveillance   DILATION AND CURETTAGE OF UTERUS      ENDOMETRIAL ABLATION     ESOPHAGOGASTRODUODENOSCOPY  05/25/2008   [id number] small bowel    ESOPHAGOGASTRODUODENOSCOPY N/A 01/18/2015   multiple gastric polyps in gastric fundus and cardiac, mild non-erosive gastritis. No Hpylori    ESOPHAGOGASTRODUODENOSCOPY (EGD) WITH PROPOFOL N/A 04/19/2020   Gastritis, multiple gastric polyps, normal duodenum. Negative villous changes. Negative H.pylori.    KNEE SURGERY Left 02/1989   TONSILLECTOMY     WISDOM TOOTH EXTRACTION  11/1989    Current Outpatient Medications  Medication Sig Dispense Refill   ascorbic acid (VITAMIN C) 500 MG tablet Take 500 mg by mouth daily.     Biotin w/ Vitamins C & E (HAIR/SKIN/NAILS PO) Take 3 tablets by mouth daily.     Calcium Citrate-Vitamin D (CALCIUM + D PO) Take 2 tablets by mouth daily.     Cholecalciferol (DIALYVITE VITAMIN D 5000) 125 MCG (5000 UT) capsule Take 5,000 Units by mouth daily.     ibuprofen (ADVIL) 800 MG tablet Take 800 mg by mouth at bedtime as needed for moderate pain.      levocetirizine (XYZAL) 5 MG tablet Take 5 mg by mouth daily.     magnesium gluconate (MAGONATE) 500 MG tablet Take 500 mg by mouth daily.     meloxicam (MOBIC) 15 MG tablet Take 15 mg by mouth daily as needed for pain.     pantoprazole (PROTONIX) 40 MG tablet Take 40 mg by mouth daily.   4   progesterone (PROMETRIUM) 200 MG capsule  Take 200 mg by mouth daily.     dexlansoprazole (DEXILANT) 60 MG capsule Take 60 mg by mouth daily. (Patient not taking: Reported on 06/27/2020)     dexlansoprazole (DEXILANT) 60 MG capsule Take 1 capsule (60 mg total) by mouth daily. (Patient not taking: Reported on 06/27/2020) 90 capsule 3   No current facility-administered medications for this visit.    Allergies as of 06/27/2020 - Review Complete 06/27/2020  Allergen Reaction Noted   Gluten meal  03/25/2020   Levbid [hyoscyamine sulfate] Rash 05/21/2011    Family History  Problem Relation Age of Onset   Celiac disease  Father        Legrand Como [place]   Leukemia Father    Colon polyps Mother    Hyperlipidemia Mother    Hyperlipidemia Sister    Colon cancer Neg Hx     Social History   Socioeconomic History   Marital status: Married    Spouse name: Not on file   Number of children: 2   Years of education: Not on file   Highest education level: Not on file  Occupational History   Occupation: Public affairs consultant pharmacy  Tobacco Use   Smoking status: Never Smoker   Smokeless tobacco: Never Used  Substance and Sexual Activity   Alcohol use: Yes    Alcohol/week: 0.0 standard drinks    Comment: occ   Drug use: No   Sexual activity: Yes    Partners: Male    Comment: No birth control  Other Topics Concern   Not on file  Social History Narrative   Lives at home with husband.  He has two children.    Social Determinants of Health   Financial Resource Strain:    Difficulty of Paying Living Expenses: Not on file  Food Insecurity:    Worried About Charity fundraiser in the Last Year: Not on file   [employer] of Food in the Last Year: Not on file  Transportation Needs:    Lack of Transportation (Medical): Not on file   Lack of Transportation (Non-Medical): Not on file  Physical Activity:    Days of Exercise per Week: Not on file   Minutes of Exercise per Session: Not on file  Stress:    Feeling of Stress : Not on file  Social Connections:    Frequency of Communication with Friends and Family: Not on file   Frequency of Social Gatherings with Friends and Family: Not on file   Attends Religious Services: Not on file   Active Member of Clubs or Organizations: Not on file   Attends Archivist Meetings: Not on file   Marital Status: Not on file    Review of Systems: Gen: Denies fever, chills, anorexia. Denies fatigue, weakness, weight loss.  CV: Denies chest pain, palpitations, syncope, peripheral edema, and claudication. Resp: Denies dyspnea at rest,  cough, wheezing, coughing up blood, and pleurisy. GI: see HPI Derm: Denies rash, itching, dry skin Psych: Denies depression, anxiety, memory loss, confusion. No homicidal or suicidal ideation.  Heme: Denies bruising, bleeding, and enlarged lymph nodes.  Physical Exam: BP 136/81    Pulse 87    Temp (!) 97 F (36.1 C)    Ht 5\' 4"  (1.626 m)    Wt 212 lb (96.2 kg)    BMI 36.39 kg/m  General:   Alert and oriented. No distress noted. Pleasant and cooperative.  Head:  Normocephalic and atraumatic. Eyes:  Conjuctiva clear without scleral icterus. Mouth:  Mask  in place Abdomen:  +BS, soft, mild TTP diffusely with just light touch, no rebound or guarding, and non-distended. No HSM or masses noted. Msk:  Symmetrical without gross deformities. Normal posture. Extremities:  Without edema. Neurologic:  Alert and  oriented x4 Psych:  Alert and cooperative. Normal mood and affect.  ASSESSMENT: TAYSHA MAJEWSKI is a 49 y.o. female presenting today with history of chronic GERD, long-standing history of abdomina pain, s/p EGD and colonoscopy recently with negative duodenal biopsies and colonoscopy unrevealing.   Persistent vague abdominal pain that seems to be more abdominal wall pain component. We will pursue CT as she has not had any imaging recently. Gallbladder absent. She does endorse heightened stress. May be a good candidate for a TCA in setting of chronic abdominal pain, IBS.   PLAN:  CT abd/pelvis with contrast  Continue Protonix daily  Further recommendations to follow.  Annitta Needs, PhD, ANP-BC [facility]astroenter[facility]

## 2020-07-03 NOTE — Patient Instructions (Signed)
PA for CT abd/pelvis w/contrast submitted via [employer]. Order ID: [id number], valid 07/03/20-12/29/20.

## 2020-07-17 DIAGNOSIS — Z79899 Other long term (current) drug therapy: Secondary | ICD-10-CM | POA: Diagnosis not present

## 2020-07-17 DIAGNOSIS — Q828 Other specified congenital malformations of skin: Secondary | ICD-10-CM | POA: Diagnosis not present

## 2020-07-17 DIAGNOSIS — L0101 Non-bullous impetigo: Secondary | ICD-10-CM | POA: Diagnosis not present

## 2020-07-17 DIAGNOSIS — Z79891 Long term (current) use of opiate analgesic: Secondary | ICD-10-CM | POA: Diagnosis not present

## 2020-07-19 ENCOUNTER — Ambulatory Visit (HOSPITAL_COMMUNITY): Payer: BC Managed Care – PPO

## 2020-08-14 DIAGNOSIS — R1084 Generalized abdominal pain: Secondary | ICD-10-CM | POA: Diagnosis not present

## 2020-08-15 ENCOUNTER — Ambulatory Visit (HOSPITAL_COMMUNITY)
Admission: RE | Admit: 2020-08-15 | Discharge: 2020-08-15 | Disposition: A | Discharge status: Home / Self Care | Payer: BC Managed Care – PPO | Source: Ambulatory Visit | Attending: Gastroenterology | Admitting: Gastroenterology

## 2020-08-15 ENCOUNTER — Other Ambulatory Visit: Payer: Self-pay

## 2020-08-15 DIAGNOSIS — R1084 Generalized abdominal pain: Secondary | ICD-10-CM | POA: Diagnosis not present

## 2020-08-15 DIAGNOSIS — R109 Unspecified abdominal pain: Secondary | ICD-10-CM | POA: Diagnosis not present

## 2020-08-15 LAB — PREGNANCY, URINE: Preg Test, Ur: NEGATIVE

## 2020-08-15 MED ORDER — IOHEXOL 300 MG/ML  SOLN
100.0000 mL | Freq: Once | INTRAMUSCULAR | Status: AC | PRN
  Administered 2020-08-15: 100 mL via INTRAVENOUS

## 2020-08-20 ENCOUNTER — Other Ambulatory Visit: Payer: Self-pay | Admitting: Gastroenterology

## 2020-08-20 DIAGNOSIS — R109 Unspecified abdominal pain: Secondary | ICD-10-CM

## 2020-08-20 MED ORDER — AMITRIPTYLINE HCL 10 MG PO TABS: 10.0000 mg | ORAL_TABLET | Freq: Every day | ORAL | 1 refills | Status: DC

## 2020-09-02 ENCOUNTER — Other Ambulatory Visit: Payer: Self-pay

## 2020-09-02 ENCOUNTER — Other Ambulatory Visit: Payer: BC Managed Care – PPO

## 2020-09-02 DIAGNOSIS — Z20822 Contact with and (suspected) exposure to covid-19: Secondary | ICD-10-CM

## 2020-09-04 LAB — SARS-COV-2, NAA 2 DAY TAT

## 2020-09-04 LAB — NOVEL CORONAVIRUS, NAA: SARS-CoV-2, NAA: DETECTED — AB

## 2020-09-10 ENCOUNTER — Other Ambulatory Visit: Payer: Self-pay | Admitting: Gastroenterology

## 2020-10-31 ENCOUNTER — Other Ambulatory Visit: Payer: Self-pay | Admitting: Gastroenterology

## 2020-10-31 DIAGNOSIS — R109 Unspecified abdominal pain: Secondary | ICD-10-CM

## 2020-12-19 ENCOUNTER — Ambulatory Visit: Payer: BC Managed Care – PPO | Admitting: Gastroenterology

## 2020-12-25 ENCOUNTER — Ambulatory Visit: Payer: BC Managed Care – PPO | Admitting: Gastroenterology

## 2020-12-26 ENCOUNTER — Other Ambulatory Visit: Payer: Self-pay

## 2020-12-26 ENCOUNTER — Ambulatory Visit (INDEPENDENT_AMBULATORY_CARE_PROVIDER_SITE_OTHER): Payer: BC Managed Care – PPO | Admitting: Gastroenterology

## 2020-12-26 ENCOUNTER — Encounter: Payer: Self-pay | Admitting: Gastroenterology

## 2020-12-26 ENCOUNTER — Telehealth: Payer: Self-pay

## 2020-12-26 VITALS — BP 124/82 | HR 80 | Temp 97.2°F | Ht 64.0 in | Wt 210.2 lb

## 2020-12-26 DIAGNOSIS — K219 Gastro-esophageal reflux disease without esophagitis: Secondary | ICD-10-CM

## 2020-12-26 DIAGNOSIS — R1013 Epigastric pain: Secondary | ICD-10-CM | POA: Insufficient documentation

## 2020-12-26 DIAGNOSIS — R109 Unspecified abdominal pain: Secondary | ICD-10-CM

## 2020-12-26 MED ORDER — ONDANSETRON 4 MG PO TBDP: 4.0000 mg | ORAL_TABLET | Freq: Three times a day (TID) | ORAL | 3 refills | Status: AC | PRN

## 2020-12-26 MED ORDER — AMITRIPTYLINE HCL 10 MG PO TABS: 20.0000 mg | ORAL_TABLET | Freq: Every day | ORAL | 3 refills | Status: DC

## 2020-12-26 NOTE — Patient Instructions (Signed)
We are increasing amitriptyline to 20 mg each evening. I have sent in a prescription for this.  Let's keep Protonix at once per day for now until we review the gastric emptying study.   Please message in a few weeks with how you are doing with the amitriptyline dosage adjustment. I suspect we may need to increase Protonix sooner rather than later.  We will see you in 3 months!  I enjoyed seeing you again today! As you know, I value our relationship and want to provide genuine, compassionate, and quality care. I welcome your feedback. If you receive a survey regarding your visit,  I greatly appreciate you taking time to fill this out. See you next time!  Annitta Needs, PhD, ANP-BC [facility] Gastroenterology

## 2020-12-26 NOTE — Progress Notes (Signed)
Referring Provider: Celene Squibb, MD Primary Care Physician:  Celene Squibb, MD Primary GI: Dr. Abbey Chatters   Chief Complaint  Patient presents with  . Abdominal Pain    Still ongoing, yesterday had more RUQ pain. Some nausea, no vomiting.     HPI:   Lisa Garner is a 50 y.o. female presenting today with a history of chronic GERD, IBS, long-standing history of abdominal pain, gastritis on EGD, negative duodenal biopsies for celiac disease, and colonoscopy unimpressive. Gallbladder absent. CT Dec 2021 unrevealing. Sent in amitriptyline 10 mg to take daily.   Protonix once daily. Dexilant too expensive as copay was pricey. Has high deductible. Notes postprandial abdominal pain at times. Had coffee and breakfast bar and feels nauseated, bloated, gassy. Moreso of a nausea feeling. Feels full off of small amounts of food. Nausea daily. Monday had pain anytime bending over in RUQ. Will sometimes have more of a shooting pain. Most bothersome symptom is nausea.  Abdominal wall pain different than other discomfort after eating. No constipation or diarrhea. Has sensitivity in abdomen with just light touch. No unexplained weight loss.   Past Medical History:  Diagnosis Date  . Claudication [facility])    Lower arterial duplex scan 01/05/08 - Normal  . Congenital pulmonary stenosis    mild  . Fatty liver   . GERD (gastroesophageal reflux disease)   . Helicobacter pylori gastritis 2005  . IBS (irritable bowel syndrome)   . Pulmonary hypertension (HCC)    TTE 10/23/11 - LV size normal. LV EF 50-55%  . S/P endoscopy 05/25/08   Dr Maryclare Bean small bowel biopsy, reactive gastropathy  . Skin disorder     Past Surgical History:  Procedure Laterality Date  . arm surgery    . BIOPSY  04/19/2020   Procedure: BIOPSY;  Surgeon: [name], DO;  [facility];  Service: Endoscopy;;  duodenal  . CHOLECYSTECTOMY  1997   ERCP with sphincterotomy/stone extraction  . COLONOSCOPY N/A  01/18/2015   2 colon polyps, examined TI normal. Small internal hemorrhoids. Tubular adenoma  . COLONOSCOPY WITH PROPOFOL N/A 04/19/2020   Non-bleeding internal hemorrhoids, TI normal. 7 years surveillance  . DILATION AND CURETTAGE OF UTERUS    . ENDOMETRIAL ABLATION    . ESOPHAGOGASTRODUODENOSCOPY  05/25/2008   ION:[id number] small bowel   . ESOPHAGOGASTRODUODENOSCOPY N/A 01/18/2015   multiple gastric polyps in gastric fundus and cardiac, mild non-erosive gastritis. No Hpylori   . ESOPHAGOGASTRODUODENOSCOPY (EGD) WITH PROPOFOL N/A 04/19/2020   Gastritis, multiple gastric polyps, normal duodenum. Negative villous changes. Negative H.pylori.   Marland Kitchen KNEE SURGERY Left 02/1989  . TONSILLECTOMY    . WISDOM TOOTH EXTRACTION  11/1989    Current Outpatient Medications  Medication Sig Dispense Refill  . acitretin (SORIATANE) 10 MG capsule Take 10 mg by mouth daily before breakfast.    . amitriptyline (ELAVIL) 10 MG tablet TAKE (1) TABLET BY MOUTH AT BEDTIME. 30 tablet 3  . ascorbic acid (VITAMIN C) 500 MG tablet Take 500 mg by mouth daily.    . Biotin w/ Vitamins C & E (HAIR/SKIN/NAILS PO) Take 3 tablets by mouth daily.    . Calcium Citrate-Vitamin D (CALCIUM + D PO) Take 2 tablets by mouth daily.    . Cholecalciferol (DIALYVITE VITAMIN D 5000) 125 MCG (5000 UT) capsule Take 5,000 Units by mouth daily.    Marland Kitchen ibuprofen (ADVIL) 800 MG tablet Take 800 mg by mouth at bedtime as needed for moderate pain.     Marland Kitchen  levocetirizine (XYZAL) 5 MG tablet Take 5 mg by mouth daily.    . magnesium gluconate (MAGONATE) 500 MG tablet Take 500 mg by mouth daily.    . meloxicam (MOBIC) 15 MG tablet Take 15 mg by mouth daily as needed for pain.    . pantoprazole (PROTONIX) 40 MG tablet TAKE ONE TABLET BY MOUTH ONCE DAILY. 90 tablet 3  . progesterone (PROMETRIUM) 200 MG capsule Take 200 mg by mouth daily.     No current facility-administered medications for this visit.    Allergies as of 12/26/2020 - Review Complete 12/26/2020   Allergen Reaction Noted  . Gluten meal  03/25/2020  . Levbid [hyoscyamine sulfate] Rash 05/21/2011    Family History  Problem Relation Age of Onset  . Celiac disease Father        Lisa Garner  . Leukemia Father   . Colon polyps Mother   . Hyperlipidemia Mother   . Hyperlipidemia Sister   . Colon cancer Neg Hx     Social History   Socioeconomic History  . Marital status: Married    Spouse name: Not on file  . Number of children: 2  . Years of education: Not on file  . Highest education level: Not on file  Occupational History  . Occupation: Health and safety inspector  Tobacco Use  . Smoking status: Never Smoker  . Smokeless tobacco: Never Used  Substance and Sexual Activity  . Alcohol use: Yes    Alcohol/week: 0.0 standard drinks    Comment: occ  . Drug use: No  . Sexual activity: Yes    Partners: Male    Comment: No birth control  Other Topics Concern  . Not on file  Social History Narrative   Lives at home with husband.  He has two children.    Social Determinants of Health   Financial Resource Strain: Not on file  Food Insecurity: Not on file  Transportation Needs: Not on file  Physical Activity: Not on file  Stress: Not on file  Social Connections: Not on file    Review of Systems: Gen: Denies fever, chills, anorexia. Denies fatigue, weakness, weight loss.  CV: Denies chest pain, palpitations, syncope, peripheral edema, and claudication. Resp: Denies dyspnea at rest, cough, wheezing, coughing up blood, and pleurisy. GI: see HPI Derm: Denies rash, itching, dry skin Psych: Denies depression, anxiety, memory loss, confusion. No homicidal or suicidal ideation.  Heme: Denies bruising, bleeding, and enlarged lymph nodes.  Physical Exam: BP 124/82   Pulse 80   Temp (!) 97.2 F (36.2 C)   Ht 5\' 4"  (1.626 m)   Wt 210 lb 3.2 oz (95.3 kg)   LMP 12/25/2020   BMI 36.08 kg/m  General:   Alert and oriented. No distress noted. Pleasant and  cooperative.  Head:  Normocephalic and atraumatic. Eyes:  Conjuctiva clear without scleral icterus. Mouth:  Mask in place Abdomen:  +BS, soft, mild TTP with light touch diffusely and non-distended. No rebound or guarding. No HSM or masses noted. Msk:  Symmetrical without gross deformities. Normal posture. Extremities:  Without edema. Neurologic:  Alert and  oriented x4 Psych:  Alert and cooperative. Normal mood and affect.  ASSESSMENT: Lisa Garner is a 51 y.o. female presenting today with history of GERD, chronic abdominal pain, and nausea without vomiting. Evaluation thus far unrevealing and encouraging: CT negative, colonoscopy/EGD on file, negative celiac, labs unrevealing recently. No changes in bowel habits or alarm signs/symptoms.   We have started on very low dose  amitriptyline at last visit, so we will increase this to 20 mg daily. I am holding off on BID PPI until after completing GES. Will assess for possible gastroparesis as she notes bloating, nausea, early satiety. Low threshold for increased BID PPI dosing but will use a stepwise approach. Abdominal all pain appears different and not associated with the postprandial symptoms. Gallbladder absent. Less likely SIBO but unable to exclude. If above is exhausted, could consider empiric trial of Xifaxan.    PLAN:  Increase amitriptyline to 20 mg each evening Keep PPI once daily for now but anticipate increasing in near future GES in near future Return in 3 months  Annitta Needs, PhD, [facility]  [facility]

## 2020-12-26 NOTE — Telephone Encounter (Signed)
No PA needed for GES per [employer] website. 

## 2021-01-02 ENCOUNTER — Other Ambulatory Visit (HOSPITAL_COMMUNITY): Payer: BC Managed Care – PPO

## 2021-01-23 ENCOUNTER — Encounter (HOSPITAL_COMMUNITY): Payer: Self-pay

## 2021-01-23 ENCOUNTER — Ambulatory Visit (HOSPITAL_COMMUNITY)
Admission: RE | Admit: 2021-01-23 | Discharge: 2021-01-23 | Disposition: A | Discharge status: Home / Self Care | Payer: BC Managed Care – PPO | Source: Ambulatory Visit | Attending: Gastroenterology | Admitting: Gastroenterology

## 2021-01-23 DIAGNOSIS — R1013 Epigastric pain: Secondary | ICD-10-CM

## 2021-01-23 DIAGNOSIS — Z0389 Encounter for observation for other suspected diseases and conditions ruled out: Secondary | ICD-10-CM | POA: Diagnosis not present

## 2021-01-23 MED ORDER — TECHNETIUM TC 99M SULFUR COLLOID
2.0000 | Freq: Once | INTRAVENOUS | Status: AC | PRN
  Administered 2021-01-23: 2.2 via ORAL

## 2021-03-20 DIAGNOSIS — Z1231 Encounter for screening mammogram for malignant neoplasm of breast: Secondary | ICD-10-CM | POA: Diagnosis not present

## 2021-03-20 DIAGNOSIS — Z6835 Body mass index (BMI) 35.0-35.9, adult: Secondary | ICD-10-CM | POA: Diagnosis not present

## 2021-03-20 DIAGNOSIS — Z01419 Encounter for gynecological examination (general) (routine) without abnormal findings: Secondary | ICD-10-CM | POA: Diagnosis not present

## 2021-03-20 DIAGNOSIS — R011 Cardiac murmur, unspecified: Secondary | ICD-10-CM | POA: Insufficient documentation

## 2021-03-20 DIAGNOSIS — R87619 Unspecified abnormal cytological findings in specimens from cervix uteri: Secondary | ICD-10-CM | POA: Insufficient documentation

## 2021-04-08 ENCOUNTER — Ambulatory Visit (INDEPENDENT_AMBULATORY_CARE_PROVIDER_SITE_OTHER): Payer: BC Managed Care – PPO | Admitting: Gastroenterology

## 2021-04-08 ENCOUNTER — Other Ambulatory Visit: Payer: Self-pay

## 2021-04-08 ENCOUNTER — Encounter: Payer: Self-pay | Admitting: Gastroenterology

## 2021-04-08 VITALS — BP 118/82 | HR 87 | Temp 96.9°F | Ht 65.0 in | Wt 205.4 lb

## 2021-04-08 DIAGNOSIS — K219 Gastro-esophageal reflux disease without esophagitis: Secondary | ICD-10-CM | POA: Diagnosis not present

## 2021-04-08 DIAGNOSIS — R109 Unspecified abdominal pain: Secondary | ICD-10-CM

## 2021-04-08 MED ORDER — AMITRIPTYLINE HCL 10 MG PO TABS: ORAL_TABLET | ORAL | 3 refills | Status: DC

## 2021-04-08 NOTE — Progress Notes (Signed)
Referring Provider: Celene Squibb, MD Primary Care Physician:  Celene Squibb, MD Primary GI: Dr. Abbey Chatters   Chief Complaint  Patient presents with   Gastroesophageal Reflux    Ok as long as she takes med   Abdominal Pain    Not as bad    HPI:   Lisa Garner is a 50 y.o. female presenting today with a history of history of chronic GERD, IBS, long-standing history of abdominal pain, gastritis on EGD, negative duodenal biopsies for celiac disease, and colonoscopy unimpressive. Gallbladder absent. CT Dec 2021 unrevealing. Amitriptyline increased to 20 mg at last visit. Due to chronic nausea, GES was completed and normal.   Abdominal pain from time to time. Occasional bloating. If husband resting arm on her stomach, will have discomfort. Sometimes notices during the day. Overall much improved. No postprandial pain. Bloating improved overall. No overt GI bleeding. GERD controlled as long as taking pantoprazole daily.   Past Medical History:  Diagnosis Date   Claudication [facility])    Lower arterial duplex scan 01/05/08 - Normal   Congenital pulmonary stenosis    mild   Fatty liver    GERD (gastroesophageal reflux disease)    Helicobacter pylori gastritis 2005   IBS (irritable bowel syndrome)    Pulmonary hypertension (HCC)    TTE 10/23/11 - LV size normal. LV EF 50-55%   S/P endoscopy 05/25/08   Dr Maryclare Bean small bowel biopsy, reactive gastropathy   Skin disorder     Past Surgical History:  Procedure Laterality Date   arm surgery     BIOPSY  04/19/2020   Procedure: BIOPSY;  Surgeon: [name], DO;  [facility];  Service: Endoscopy;;  duodenal   CHOLECYSTECTOMY  1997   ERCP with sphincterotomy/stone extraction   COLONOSCOPY N/A 01/18/2015   2 colon polyps, examined TI normal. Small internal hemorrhoids. Tubular adenoma   COLONOSCOPY WITH PROPOFOL N/A 04/19/2020   Non-bleeding internal hemorrhoids, TI normal. 7 years surveillance   DILATION AND CURETTAGE  OF UTERUS     ENDOMETRIAL ABLATION     ESOPHAGOGASTRODUODENOSCOPY  05/25/2008   [id number] small bowel    ESOPHAGOGASTRODUODENOSCOPY N/A 01/18/2015   multiple gastric polyps in gastric fundus and cardiac, mild non-erosive gastritis. No Hpylori    ESOPHAGOGASTRODUODENOSCOPY (EGD) WITH PROPOFOL N/A 04/19/2020   Gastritis, multiple gastric polyps, normal duodenum. Negative villous changes. Negative H.pylori.    KNEE SURGERY Left 02/1989   TONSILLECTOMY     WISDOM TOOTH EXTRACTION  11/1989    Current Outpatient Medications  Medication Sig Dispense Refill   acitretin (SORIATANE) 10 MG capsule Take 10 mg by mouth daily before breakfast.     amitriptyline (ELAVIL) 10 MG tablet Take 2 tablets (20 mg total) by mouth at bedtime. 60 tablet 3   ascorbic acid (VITAMIN C) 500 MG tablet Take 500 mg by mouth daily.     Biotin w/ Vitamins C & E (HAIR/SKIN/NAILS PO) Take 3 tablets by mouth daily.     Calcium Citrate-Vitamin D (CALCIUM + D PO) Take 2 tablets by mouth daily.     Cholecalciferol (DIALYVITE VITAMIN D 5000) 125 MCG (5000 UT) capsule Take 5,000 Units by mouth daily.     ibuprofen (ADVIL) 800 MG tablet Take 800 mg by mouth at bedtime as needed for moderate pain.      levocetirizine (XYZAL) 5 MG tablet Take 5 mg by mouth daily.     magnesium gluconate (MAGONATE) 500 MG tablet Take 500 mg by mouth  daily.     meloxicam (MOBIC) 15 MG tablet Take 15 mg by mouth daily as needed for pain.     ondansetron (ZOFRAN ODT) 4 MG disintegrating tablet Take 1 tablet (4 mg total) by mouth every 8 (eight) hours as needed for nausea or vomiting. 30 tablet 3   pantoprazole (PROTONIX) 40 MG tablet TAKE ONE TABLET BY MOUTH ONCE DAILY. 90 tablet 3   progesterone (PROMETRIUM) 200 MG capsule Take 200 mg by mouth daily.     amitriptyline (ELAVIL) 10 MG tablet TAKE (1) TABLET BY MOUTH AT BEDTIME. (Patient not taking: Reported on 04/08/2021) 30 tablet 3   No current facility-administered medications for this visit.     Allergies as of 04/08/2021 - Review Complete 04/08/2021  Allergen Reaction Noted   Gluten meal  03/25/2020   Levbid [hyoscyamine sulfate] Rash 05/21/2011    Family History  Problem Relation Age of Onset   Celiac disease Father        Legrand Como [place]   Leukemia Father    Colon polyps Mother    Hyperlipidemia Mother    Hyperlipidemia Sister    Colon cancer Neg Hx     Social History   Socioeconomic History   Marital status: Married    Spouse name: Not on file   Number of children: 2   Years of education: Not on file   Highest education level: Not on file  Occupational History   Occupation: Public affairs consultant pharmacy  Tobacco Use   Smoking status: Never   Smokeless tobacco: Never  Substance and Sexual Activity   Alcohol use: Yes    Alcohol/week: 0.0 standard drinks    Comment: occ   Drug use: No   Sexual activity: Yes    Partners: Male    Comment: No birth control  Other Topics Concern   Not on file  Social History Narrative   Lives at home with husband.  He has two children.    Social Determinants of Health   Financial Resource Strain: Not on file  Food Insecurity: Not on file  Transportation Needs: Not on file  Physical Activity: Not on file  Stress: Not on file  Social Connections: Not on file    Review of Systems: Gen: Denies fever, chills, anorexia. Denies fatigue, weakness, weight loss.  CV: Denies chest pain, palpitations, syncope, peripheral edema, and claudication. Resp: Denies dyspnea at rest, cough, wheezing, coughing up blood, and pleurisy. GI: see HPI Derm: Denies rash, itching, dry skin Psych: Denies depression, anxiety, memory loss, confusion. No homicidal or suicidal ideation.  Heme: Denies bruising, bleeding, and enlarged lymph nodes.  Physical Exam: BP 118/82   Pulse 87   Temp (!) 96.9 F (36.1 C) (Temporal)   Ht '5\' 5"'$  (1.651 m)   Wt 205 lb 6.4 oz (93.2 kg)   LMP 02/05/2021 (Approximate)   BMI 34.18 kg/m  General:   Alert  and oriented. No distress noted. Pleasant and cooperative.  Head:  Normocephalic and atraumatic. Eyes:  Conjuctiva clear without scleral icterus. Mouth: mask in place Abdomen:  +BS, soft, mildly TTP diffusely at baseline and non-distended. No rebound or guarding. No HSM or masses noted. Msk:  Symmetrical without gross deformities. Normal posture. Extremities:  Without edema. Neurologic:  Alert and  oriented x4 Psych:  Alert and cooperative. Normal mood and affect.  ASSESSMENT/PLAN: Lisa Garner is a 50 y.o. female presenting today with history of GERD, chronic abdominal pain, and nausea without vomiting. Evaluation thus far unrevealing and encouraging: CT negative,  colonoscopy/EGD on file, negative celiac, labs unrevealing recently. No changes in bowel habits or alarm signs/symptoms.  She has done well with amitriptyline and actually increased this to 20 mg daily at last visit. She has had improvement in overall symptoms of chronic abdominal pain.  GERD well-controlled, and nausea has improved.   Will see her back in 1 year or sooner if needed.   Annitta Needs, PhD, ANP-BC [facility] Gastroenterology

## 2021-04-08 NOTE — Patient Instructions (Addendum)
I have refilled amitriptyline for you!  Enjoy your beach get away!  Please call if any concerns or message.  We will see you back in 1 year!  I enjoyed seeing you again today! As you know, I value our relationship and want to provide genuine, compassionate, and quality care. I welcome your feedback. If you receive a survey regarding your visit,  I greatly appreciate you taking time to fill this out. See you next time!  Annitta Needs, PhD, ANP-BC [facility] Gastroenterology

## 2021-04-30 DIAGNOSIS — Q828 Other specified congenital malformations of skin: Secondary | ICD-10-CM | POA: Diagnosis not present

## 2021-04-30 DIAGNOSIS — Z79899 Other long term (current) drug therapy: Secondary | ICD-10-CM | POA: Diagnosis not present

## 2021-08-13 DIAGNOSIS — Q828 Other specified congenital malformations of skin: Secondary | ICD-10-CM | POA: Diagnosis not present

## 2021-08-13 DIAGNOSIS — Z79899 Other long term (current) drug therapy: Secondary | ICD-10-CM | POA: Diagnosis not present

## 2021-08-26 ENCOUNTER — Other Ambulatory Visit: Payer: Self-pay | Admitting: Gastroenterology

## 2021-09-12 IMAGING — CT CT ABD-PELV W/ CM
2 of 5 series · 16 of 46 positions shown, 18 images · IV contrast (Omnipaque or Isovue)
Comparison: CT abdomen pelvis dated 05/26/2011.

CLINICAL DATA: 49-year-old female with abdominal pain.

EXAM:
CT ABDOMEN AND PELVIS WITH CONTRAST
TECHNIQUE: Multidetector CT imaging of the abdomen and pelvis was performed
using the standard protocol following bolus administration of
intravenous contrast.
CONTRAST:  100mL OMNIPAQUE IOHEXOL 300 MG/ML  SOLN

[Series 2: axial st · axial · 0.98mm/px · z∈[+645,+1040]mm · 13 of 89 slices shown, 15 images]
[im 5/89  soft-tissue]
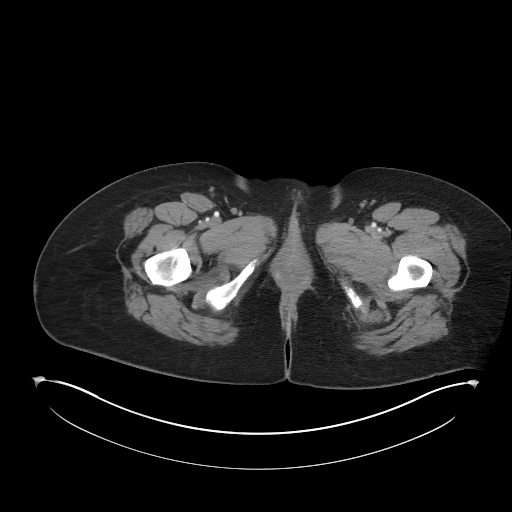
[im 5/89  bone]
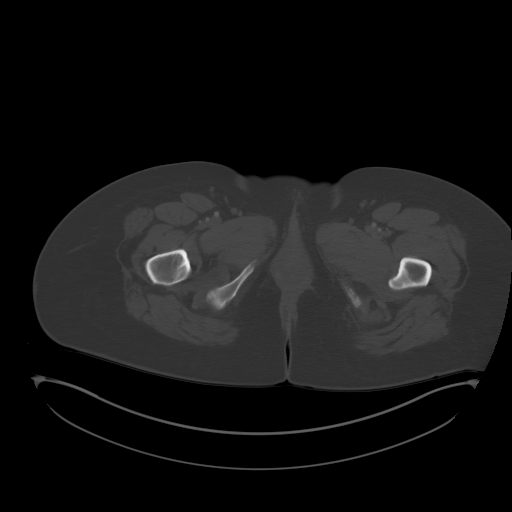
[im 14/89  soft-tissue]
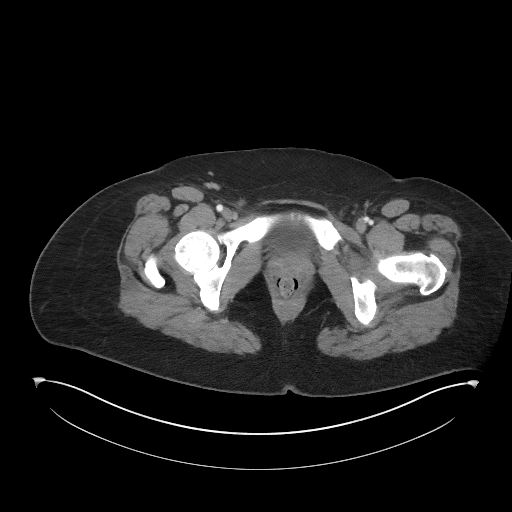
[im 19/89  soft-tissue]
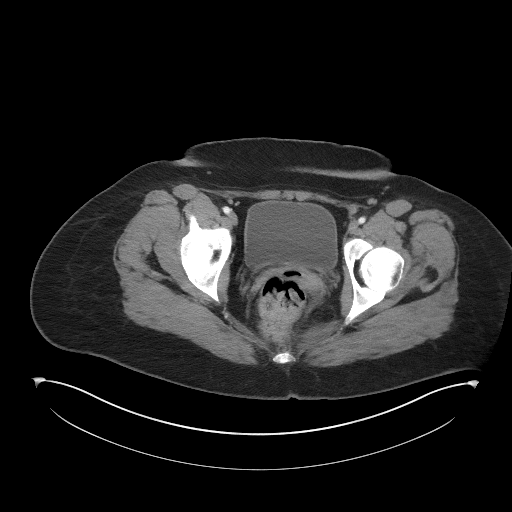
[im 24/89  soft-tissue]
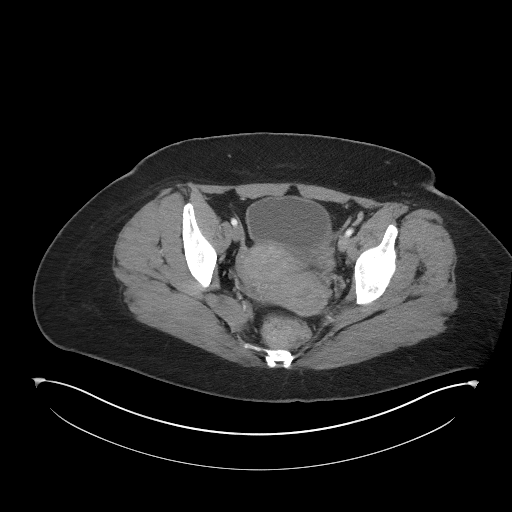
[im 33/89  soft-tissue]
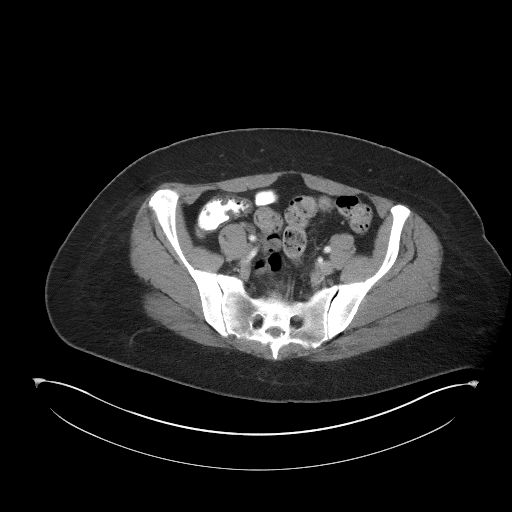
[im 38/89  soft-tissue]
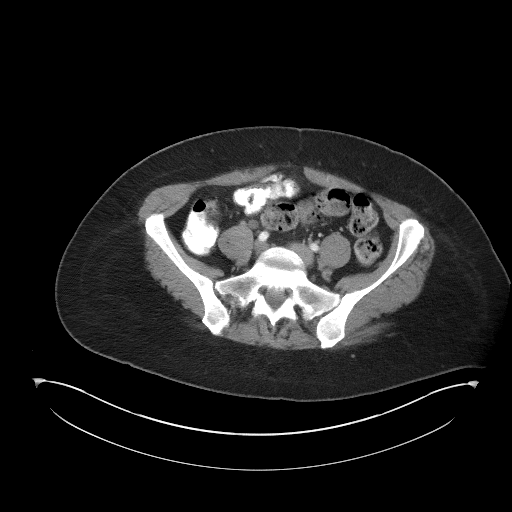
[im 47/89  soft-tissue]
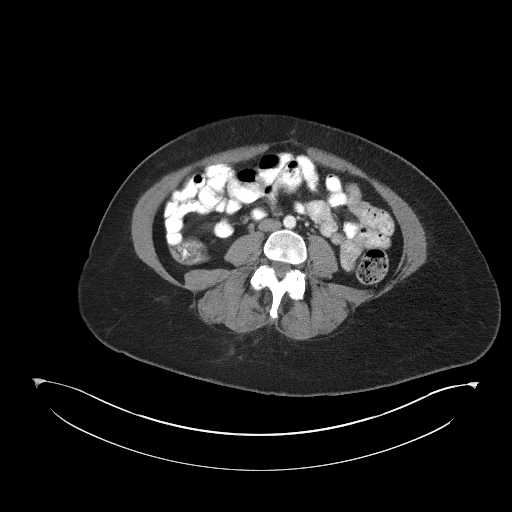
[im 51/89  soft-tissue]
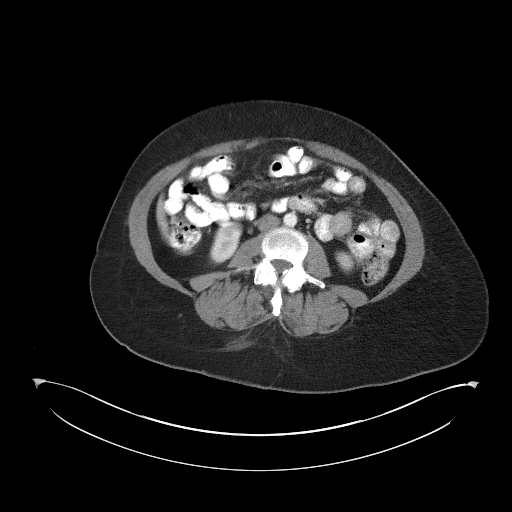
[im 56/89  soft-tissue]
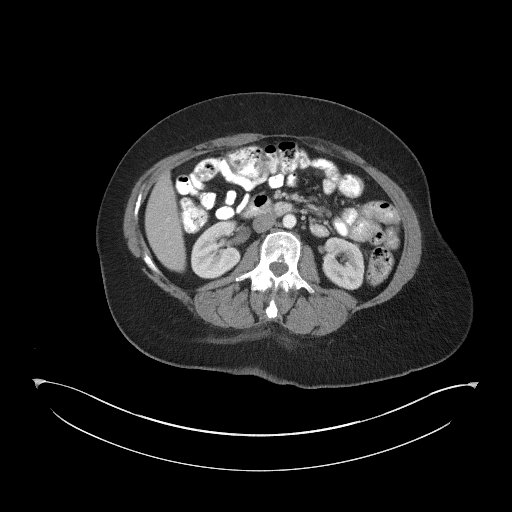
[im 56/89  bone]
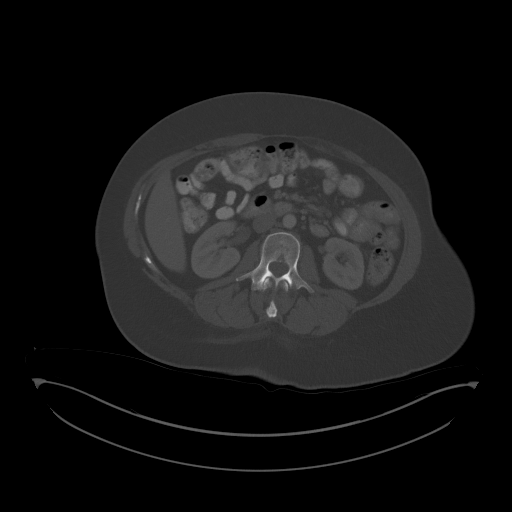
[im 65/89  soft-tissue]
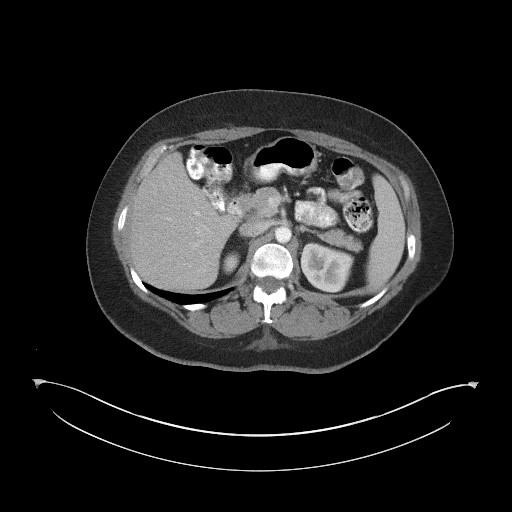
[im 70/89  soft-tissue]
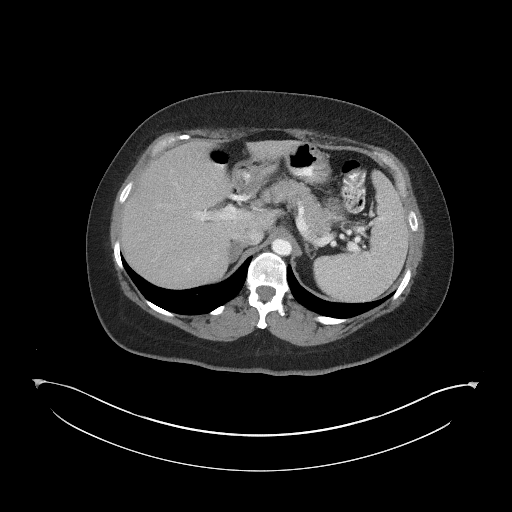
[im 75/89  soft-tissue]
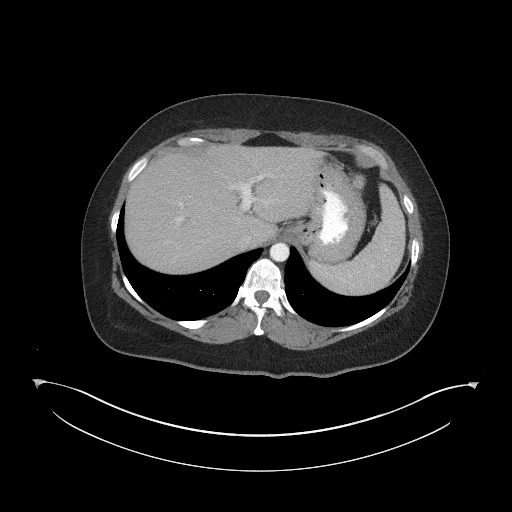
[im 84/89  soft-tissue]
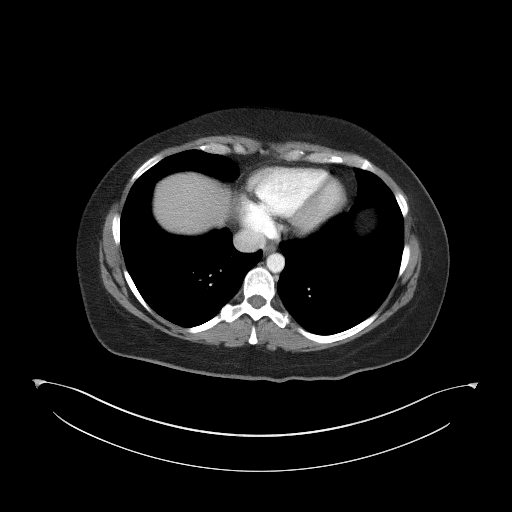

[Series 5: coronal st · coronal · 0.77mm/px · 3 of 117 slices shown]
[im 39/117  soft-tissue]
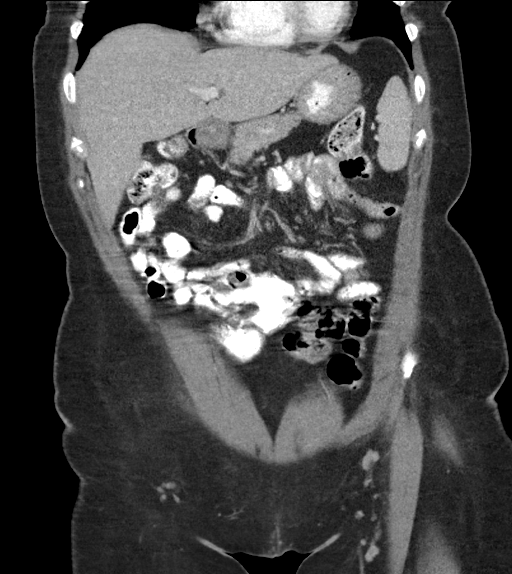
[im 52/117  soft-tissue]
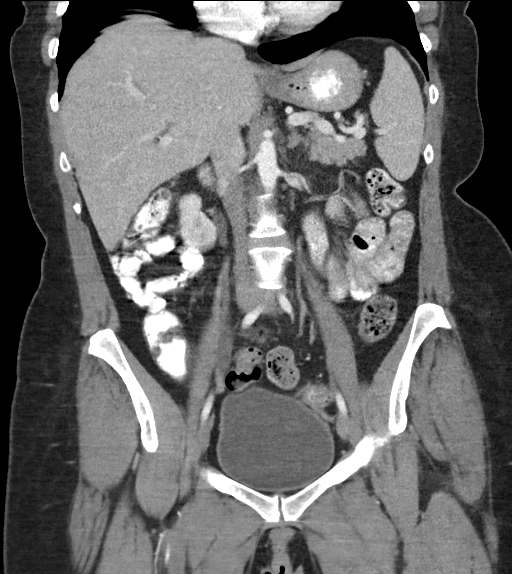
[im 65/117  soft-tissue]
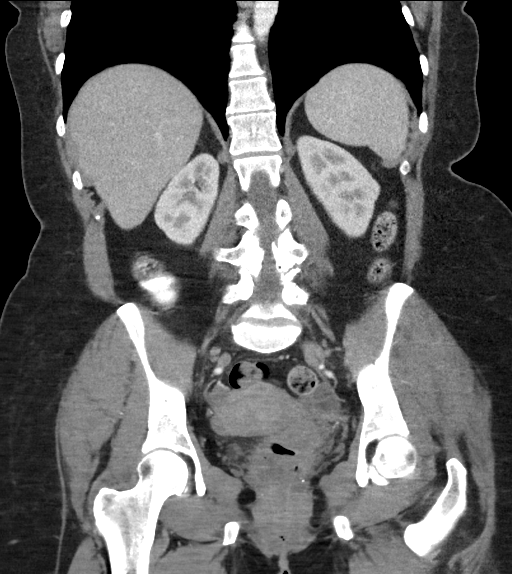

[16 of 46 positions shown; findings below may reference images not displayed]

FINDINGS: Lower chest: The visualized lung bases are clear.

No intra-abdominal free air or free fluid.

Hepatobiliary: The liver is unremarkable. No intrahepatic biliary
ductal dilatation. Cholecystectomy. No retained calcified stone
noted in the central CBD.

Pancreas: Unremarkable. No pancreatic ductal dilatation or
surrounding inflammatory changes.

Spleen: Normal in size without focal abnormality.

Adrenals/Urinary Tract: There is a 2 cm right adrenal nodule,
indeterminate but present since the study of 9929, most consistent
with an adenoma. The left adrenal gland is unremarkable. The
kidneys, visualized ureters, and urinary bladder appear
unremarkable.

Stomach/Bowel: There is no bowel obstruction or active inflammation.
The appendix is not visualized with certainty. No inflammatory
changes identified in the right lower quadrant.

Vascular/Lymphatic: The abdominal aorta and IVC unremarkable. No
portal venous gas. There is no adenopathy.

Reproductive: The uterus is anteverted. There is slight
heterogeneous appearance of the uterus, likely related to underlying
fibroids. No adnexal masses.

Other: None

Musculoskeletal: Distal sacral Tarlov cyst. No acute osseous
pathology.
IMPRESSION: 1. No acute intra-abdominal or pelvic pathology.
2. A stable 2 cm right adrenal nodule, likely an adenoma.

## 2021-09-30 DIAGNOSIS — I959 Hypotension, unspecified: Secondary | ICD-10-CM | POA: Diagnosis not present

## 2021-10-01 ENCOUNTER — Telehealth: Payer: Self-pay | Admitting: Cardiology

## 2021-10-01 NOTE — Telephone Encounter (Signed)
Patient is requesting to switch providers from Dr. Percival Spanish to Dr. Harl Bowie due to location convenience.  Please advise, Thanks

## 2021-10-06 ENCOUNTER — Emergency Department (HOSPITAL_COMMUNITY): Payer: BC Managed Care – PPO

## 2021-10-06 ENCOUNTER — Emergency Department (HOSPITAL_COMMUNITY)
Admission: EM | Admit: 2021-10-06 | Discharge: 2021-10-06 | Disposition: A | Discharge status: Home / Self Care | Payer: BC Managed Care – PPO | Attending: Emergency Medicine | Admitting: Emergency Medicine

## 2021-10-06 ENCOUNTER — Encounter (HOSPITAL_COMMUNITY): Payer: Self-pay | Admitting: *Deleted

## 2021-10-06 ENCOUNTER — Other Ambulatory Visit: Payer: Self-pay

## 2021-10-06 DIAGNOSIS — Z79899 Other long term (current) drug therapy: Secondary | ICD-10-CM | POA: Diagnosis not present

## 2021-10-06 DIAGNOSIS — Q256 Stenosis of pulmonary artery: Secondary | ICD-10-CM | POA: Insufficient documentation

## 2021-10-06 DIAGNOSIS — K589 Irritable bowel syndrome without diarrhea: Secondary | ICD-10-CM | POA: Insufficient documentation

## 2021-10-06 DIAGNOSIS — Z20822 Contact with and (suspected) exposure to covid-19: Secondary | ICD-10-CM | POA: Diagnosis not present

## 2021-10-06 DIAGNOSIS — E860 Dehydration: Secondary | ICD-10-CM | POA: Insufficient documentation

## 2021-10-06 DIAGNOSIS — I272 Pulmonary hypertension, unspecified: Secondary | ICD-10-CM | POA: Insufficient documentation

## 2021-10-06 DIAGNOSIS — R0602 Shortness of breath: Secondary | ICD-10-CM | POA: Diagnosis not present

## 2021-10-06 DIAGNOSIS — K829 Disease of gallbladder, unspecified: Secondary | ICD-10-CM | POA: Insufficient documentation

## 2021-10-06 DIAGNOSIS — R0789 Other chest pain: Secondary | ICD-10-CM | POA: Diagnosis not present

## 2021-10-06 LAB — COMPREHENSIVE METABOLIC PANEL
ALT: 16 U/L (ref 0–44)
AST: 21 U/L (ref 15–41)
Albumin: 4.1 g/dL (ref 3.5–5.0)
Alkaline Phosphatase: 60 U/L (ref 38–126)
Anion gap: 7 (ref 5–15)
BUN: 10 mg/dL (ref 6–20)
CO2: 29 mmol/L (ref 22–32)
Calcium: 9 mg/dL (ref 8.9–10.3)
Chloride: 102 mmol/L (ref 98–111)
Creatinine, Ser: 0.88 mg/dL (ref 0.44–1.00)
GFR, Estimated: >60 mL/min (ref >60)
Glucose, Bld: 94 mg/dL (ref 70–99)
Potassium: 4 mmol/L (ref 3.5–5.1)
Sodium: 138 mmol/L (ref 135–145)
Total Bilirubin: 0.2 mg/dL — ABNORMAL LOW (ref 0.3–1.2)
Total Protein: 6.7 g/dL (ref 6.5–8.1)

## 2021-10-06 LAB — RESP PANEL BY RT-PCR (FLU A&B, COVID) ARPGX2
Influenza A by PCR: NEGATIVE
Influenza B by PCR: NEGATIVE
SARS Coronavirus 2 by RT PCR: NEGATIVE

## 2021-10-06 LAB — CBC WITH DIFFERENTIAL/PLATELET
Abs Immature Granulocytes: 0.03 10*3/uL (ref 0.00–0.07)
Basophils Absolute: 0.1 10*3/uL (ref 0.0–0.1)
Basophils Relative: 1 %
Eosinophils Absolute: 0.1 10*3/uL (ref 0.0–0.5)
Eosinophils Relative: 2 %
HCT: 41.3 % (ref 36.0–46.0)
Hemoglobin: 13.2 g/dL (ref 12.0–15.0)
Immature Granulocytes: 0 %
Lymphocytes Relative: 23 %
Lymphs Abs: 1.9 10*3/uL (ref 0.7–4.0)
MCH: 28.5 pg (ref 26.0–34.0)
MCHC: 32 g/dL (ref 30.0–36.0)
MCV: 89.2 fL (ref 80.0–100.0)
Monocytes Absolute: 0.4 10*3/uL (ref 0.1–1.0)
Monocytes Relative: 5 %
Neutro Abs: 5.6 10*3/uL (ref 1.7–7.7)
Neutrophils Relative %: 69 %
Platelets: 305 10*3/uL (ref 150–400)
RBC: 4.63 MIL/uL (ref 3.87–5.11)
RDW: 13.5 % (ref 11.5–15.5)
WBC: 8.1 10*3/uL (ref 4.0–10.5)
nRBC: 0 % (ref 0.0–0.2)

## 2021-10-06 LAB — TROPONIN I (HIGH SENSITIVITY)
Troponin I (High Sensitivity): 3 ng/L (ref <18)
Troponin I (High Sensitivity): 3 ng/L (ref <18)

## 2021-10-06 LAB — D-DIMER, QUANTITATIVE: D-Dimer, Quant: 0.35 ug/mL-FEU (ref 0.00–0.50)

## 2021-10-06 MED ORDER — SODIUM CHLORIDE 0.9 % IV BOLUS (SEPSIS)
1000.0000 mL | Freq: Once | INTRAVENOUS | Status: AC
  Administered 2021-10-06: 1000 mL via INTRAVENOUS

## 2021-10-06 NOTE — ED Provider Notes (Signed)
[facility] EMERGENCY DEPARTMENT Provider Note   CSN: [id number] Arrival date & time: 10/06/21  1005     History  Chief Complaint  Patient presents with   Chest Pain    Lisa Garner is a 51 y.o. female.  Patient has a history of irritable bowel and GERD pulmonary hypertension.  She states she has been having some chest discomfort also weakness for the last few days.  The history is provided by the patient and medical records. No language interpreter was used.  Chest Pain Pain location:  L chest Pain quality: aching   Pain radiates to:  Does not radiate Pain severity:  Moderate Onset quality:  Sudden Timing:  Constant Progression:  Worsening Chronicity:  New Context: not breathing   Relieved by:  Nothing Worsened by:  Nothing Ineffective treatments:  None tried Associated symptoms: no abdominal pain, no back pain, no cough, no fatigue and no headache       Home Medications Prior to Admission medications   Medication Sig Start Date End Date Taking? Authorizing Provider  acitretin (SORIATANE) 10 MG capsule Take 10 mg by mouth daily before breakfast.   Yes [provider]  amitriptyline (ELAVIL) 10 MG tablet TAKE 2 TABLETs BY MOUTH AT BEDTIME. 04/08/21  Yes Annitta Needs, NP  ascorbic acid (VITAMIN C) 500 MG tablet Take 500 mg by mouth daily.   Yes [provider]  Biotin w/ Vitamins C & E (HAIR/SKIN/NAILS PO) Take 3 tablets by mouth daily.   Yes [provider]  Calcium Citrate-Vitamin D (CALCIUM + D PO) Take 2 tablets by mouth daily.   Yes [provider]  Cholecalciferol (DIALYVITE VITAMIN D 5000) 125 MCG (5000 UT) capsule Take 5,000 Units by mouth daily.   Yes [provider]  levocetirizine (XYZAL) 5 MG tablet Take 5 mg by mouth daily. 11/29/18  Yes [provider]  magnesium gluconate (MAGONATE) 500 MG tablet Take 500 mg by mouth daily.   Yes [provider]  meloxicam (MOBIC) 15 MG tablet Take 15 mg  by mouth daily as needed for pain.   Yes [provider]  midodrine (PROAMATINE) 5 MG tablet Take 2.5 mg by mouth 3 (three) times daily. 09/30/21  Yes [provider]  pantoprazole (PROTONIX) 40 MG tablet TAKE ONE TABLET BY MOUTH ONCE DAILY. 08/27/21  Yes Annitta Needs, NP  progesterone (PROMETRIUM) 200 MG capsule Take 200 mg by mouth at bedtime. 09/01/21  Yes [provider]  ibuprofen (ADVIL) 800 MG tablet Take 800 mg by mouth at bedtime as needed for moderate pain.     [provider]  ondansetron (ZOFRAN ODT) 4 MG disintegrating tablet Take 1 tablet (4 mg total) by mouth every 8 (eight) hours as needed for nausea or vomiting. Patient not taking: Reported on 10/06/2021 12/26/20   Annitta Needs, NP      Allergies    Gluten meal and Levbid [hyoscyamine sulfate]    Review of Systems   Review of Systems  Constitutional:  Negative for appetite change and fatigue.  HENT:  Negative for congestion, ear discharge and sinus pressure.   Eyes:  Negative for discharge.  Respiratory:  Negative for cough.   Cardiovascular:  Positive for chest pain.  Gastrointestinal:  Negative for abdominal pain and diarrhea.  Genitourinary:  Negative for frequency and hematuria.  Musculoskeletal:  Negative for back pain.  Skin:  Negative for rash.  Neurological:  Negative for seizures and headaches.  Psychiatric/Behavioral:  Negative for  hallucinations.    Physical Exam Updated Vital Signs BP 125/78    Pulse 81    Temp 98.2 F (36.8 C) (Oral)    Resp 15    Ht 5\' 5"  (1.651 m)    Wt 89.8 kg    SpO2 98%    BMI 32.95 kg/m  Physical Exam Vitals and nursing note reviewed.  Constitutional:      Appearance: She is well-developed.  HENT:     Head: Normocephalic.     Nose: Nose normal.  Eyes:     General: No scleral icterus.    Conjunctiva/sclera: Conjunctivae normal.  Neck:     Thyroid: No thyromegaly.  Cardiovascular:     Rate and Rhythm: Normal rate and regular rhythm.      Heart sounds: No murmur heard.   No friction rub. No gallop.  Pulmonary:     Breath sounds: No stridor. No wheezing or rales.  Chest:     Chest wall: No tenderness.  Abdominal:     General: There is no distension.     Tenderness: There is no abdominal tenderness. There is no rebound.  Musculoskeletal:        General: Normal range of motion.     Cervical back: Neck supple.  Lymphadenopathy:     Cervical: No cervical adenopathy.  Skin:    Findings: No erythema or rash.  Neurological:     Mental Status: She is alert and oriented to person, place, and time.     Motor: No abnormal muscle tone.     Coordination: Coordination normal.  Psychiatric:        Behavior: Behavior normal.    ED Results / Procedures / Treatments   Labs (all labs ordered are listed, but only abnormal results are displayed) Labs Reviewed  COMPREHENSIVE METABOLIC PANEL - Abnormal; Notable for the following components:      Result Value   Total Bilirubin 0.2 (*)    All other components within normal limits  RESP PANEL BY RT-PCR (FLU A&B, COVID) ARPGX2  CBC WITH DIFFERENTIAL/PLATELET  D-DIMER, QUANTITATIVE  TROPONIN I (HIGH SENSITIVITY)  TROPONIN I (HIGH SENSITIVITY)    EKG EKG Interpretation  Date/Time:  Monday October 06 2021 11:21:12 EST Ventricular Rate:  77 PR Interval:  132 QRS Duration: 86 QT Interval:  375 QTC Calculation: 425 R Axis:   17 Text Interpretation: Sinus rhythm Confirmed by Milton Ferguson [id number] on 10/06/2021 12:24:27 PM  Radiology DG Chest Port 1 View  Result Date: 10/06/2021 CLINICAL DATA:  Shortness of breath EXAM: PORTABLE CHEST 1 VIEW COMPARISON:  No recent examination FINDINGS: The heart size and mediastinal contours are within normal limits. Both lungs are clear. The visualized skeletal structures are unremarkable. IMPRESSION: No active disease. Electronically Signed   By: Keane Police D.O.   On: 10/06/2021 13:28    Procedures Procedures    Medications Ordered in  ED Medications  sodium chloride 0.9 % bolus 1,000 mL (0 mLs Intravenous Stopped 10/06/21 1405)    ED Course/ Medical Decision Making/ A&P                           Medical Decision Making Amount and/or Complexity of Data Reviewed Labs: ordered. Radiology: ordered.   Labs EKG chest x-ray all unremarkable.  Patient with mild dehydration with positive orthostatic signs.  She is given a liter of fluid and she will follow-up with her PCP  This patient presents to the ED for concern  of chest pain, this involves an extensive number of treatment options, and is a complaint that carries with it a high risk of complications and morbidity.  The differential diagnosis includes MI, PE, pneumonia   Co morbidities that complicate the patient evaluation   Bowel, GERD, pulmonary hypertension   Additional history obtained:  Additional history obtained from relative External records from outside source obtained and reviewed including old records   Lab Tests:  I Ordered, and personally interpreted labs.  The pertinent results include: CBC chemistries D-dimer and troponin all negative   Imaging Studies ordered:  I ordered imaging studies including chest x-ray I independently visualized and interpreted imaging which showed negative I agree with the radiologist interpretation   Cardiac Monitoring:  The patient was maintained on a cardiac monitor.  I personally viewed and interpreted the cardiac monitored which showed an underlying rhythm of: Normal sinus rhythm   Medicines ordered and prescription drug management:  I ordered medication including normal saline for dehydration Reevaluation of the patient after these medicines showed that the patient improved I have reviewed the patients home medicines and have made adjustments as needed   Test Considered:  CT chest   Critical Interventions:  Normal saline bolus   Consultations Obtained:  No consultant  Problem List / ED  Course:  Chest discomfort   Reevaluation:  After the interventions noted above, I reevaluated the patient and found that they have :improved   Social Determinants of Health:  None   Dispostion:  After consideration of the diagnostic results and the patients response to treatment, I feel that the patent would benefit from discharge home with follow-up with her doctors.         Final Clinical Impression(s) / ED Diagnoses Final diagnoses:  Dehydration  Atypical chest pain    Rx / DC Orders ED Discharge Orders     None         Milton Ferguson, MD 10/07/21 [phone]

## 2021-10-06 NOTE — ED Notes (Signed)
Dr. Roderic Palau to bedside, orthostatics reported to him.

## 2021-10-06 NOTE — ED Triage Notes (Signed)
Pt c/o mid chest pain, headache, and bilateral leg weakness x several weeks, worsening yesterday.

## 2021-10-06 NOTE — Discharge Instructions (Signed)
Drink plenty of fluids and follow-up with your primary care doctor in the next couple days for recheck

## 2021-10-07 ENCOUNTER — Ambulatory Visit (INDEPENDENT_AMBULATORY_CARE_PROVIDER_SITE_OTHER): Payer: BC Managed Care – PPO | Admitting: Cardiology

## 2021-10-07 ENCOUNTER — Telehealth: Payer: Self-pay | Admitting: Cardiology

## 2021-10-07 ENCOUNTER — Encounter: Payer: Self-pay | Admitting: Cardiology

## 2021-10-07 VITALS — BP 116/82 | HR 105 | Ht 65.0 in | Wt 204.0 lb

## 2021-10-07 DIAGNOSIS — R42 Dizziness and giddiness: Secondary | ICD-10-CM | POA: Diagnosis not present

## 2021-10-07 DIAGNOSIS — R0789 Other chest pain: Secondary | ICD-10-CM

## 2021-10-07 NOTE — Progress Notes (Signed)
Clinical Summary Ms. Burtch is a 51 y.o.female seen today as a new patient for the following medical problems.    1.Chest pain - ER visit yesterday with chest pain - Trop neg x 2. EKG SR, no acute ischemic changes. CXR no acute process  - episode started yesterday AM. Pressure midchest, 6-7/10 in severity. Pain into right shoulder. Not positional. No relation to food. Pain lasted roughty 4-5 hours.    CAD risk factors: none      2. Pulmonary valve stenosis -history of mild stenosis by prior echo    3.Orthostatic Dizziness - long history of symptoms - symptoms Aug 2020 - episode of severe dizziness, nausea, vomiting  - low bp's at that time, 90s/60s - symptoms started about 1 month. Symptoms with standing for 5-10 minutes. Feels hot, starts to feel dizzy and heart increase, headache. Will sit down and feels better  - cup of coffee in AM, 1-2 32 oz, dinner diet Dr pepper - started midodrine, mixed compliance thus far - previously had florinef in the past, no side effects but ended up stopping.  - 10/2021 ER visit: HR increased 70-->109 with standing. No significant bp changes - amitryptine can cause orthostatic hypotension but timing does not seem to line up     4. Chronic GERD, history of Gastritis   Past Medical History:  Diagnosis Date   Claudication [facility])    Lower arterial duplex scan 01/05/08 - Normal   Congenital pulmonary stenosis    mild   Fatty liver    GERD (gastroesophageal reflux disease)    Helicobacter pylori gastritis 2005   IBS (irritable bowel syndrome)    Pulmonary hypertension (HCC)    TTE 10/23/11 - LV size normal. LV EF 50-55%   S/P endoscopy 05/25/08   Dr Maryclare Bean small bowel biopsy, reactive gastropathy   Skin disorder      Allergies  Allergen Reactions   Gluten Meal     Abdominal cramping   Levbid [Hyoscyamine Sulfate] Rash     Current Outpatient Medications  Medication Sig Dispense Refill   acitretin (SORIATANE) 10  MG capsule Take 10 mg by mouth daily before breakfast.     amitriptyline (ELAVIL) 10 MG tablet TAKE 2 TABLETs BY MOUTH AT BEDTIME. 180 tablet 3   ascorbic acid (VITAMIN C) 500 MG tablet Take 500 mg by mouth daily.     Biotin w/ Vitamins C & E (HAIR/SKIN/NAILS PO) Take 3 tablets by mouth daily.     Calcium Citrate-Vitamin D (CALCIUM + D PO) Take 2 tablets by mouth daily.     Cholecalciferol (DIALYVITE VITAMIN D 5000) 125 MCG (5000 UT) capsule Take 5,000 Units by mouth daily.     ibuprofen (ADVIL) 800 MG tablet Take 800 mg by mouth at bedtime as needed for moderate pain.      levocetirizine (XYZAL) 5 MG tablet Take 5 mg by mouth daily.     magnesium gluconate (MAGONATE) 500 MG tablet Take 500 mg by mouth daily.     meloxicam (MOBIC) 15 MG tablet Take 15 mg by mouth daily as needed for pain.     midodrine (PROAMATINE) 5 MG tablet Take 2.5 mg by mouth 3 (three) times daily.     ondansetron (ZOFRAN ODT) 4 MG disintegrating tablet Take 1 tablet (4 mg total) by mouth every 8 (eight) hours as needed for nausea or vomiting. (Patient not taking: Reported on 10/06/2021) 30 tablet 3   pantoprazole (PROTONIX) 40 MG tablet TAKE ONE TABLET BY  MOUTH ONCE DAILY. 90 tablet 3   progesterone (PROMETRIUM) 200 MG capsule Take 200 mg by mouth at bedtime.     No current facility-administered medications for this visit.     Past Surgical History:  Procedure Laterality Date   arm surgery     BIOPSY  04/19/2020   Procedure: BIOPSY;  Surgeon: [name], DO;  Location: [facility];  Service: Endoscopy;;  duodenal   CHOLECYSTECTOMY  1997   ERCP with sphincterotomy/stone extraction   COLONOSCOPY N/A 01/18/2015   2 colon polyps, examined TI normal. Small internal hemorrhoids. Tubular adenoma   COLONOSCOPY WITH PROPOFOL N/A 04/19/2020   Non-bleeding internal hemorrhoids, TI normal. 7 years surveillance   DILATION AND CURETTAGE OF UTERUS     ENDOMETRIAL ABLATION     ESOPHAGOGASTRODUODENOSCOPY  05/25/2008    CHE:NIDPOE small bowel    ESOPHAGOGASTRODUODENOSCOPY N/A 01/18/2015   multiple gastric polyps in gastric fundus and cardiac, mild non-erosive gastritis. No Hpylori    ESOPHAGOGASTRODUODENOSCOPY (EGD) WITH PROPOFOL N/A 04/19/2020   Gastritis, multiple gastric polyps, normal duodenum. Negative villous changes. Negative H.pylori.    KNEE SURGERY Left 02/1989   TONSILLECTOMY     WISDOM TOOTH EXTRACTION  11/1989     Allergies  Allergen Reactions   Gluten Meal     Abdominal cramping   Levbid [Hyoscyamine Sulfate] Rash      Family History  Problem Relation Age of Onset   Celiac disease Father        [place]   Leukemia Father    Colon polyps Mother    Hyperlipidemia Mother    Hyperlipidemia Sister    Colon cancer Neg Hx      Social History Ms. Welton reports that she has never smoked. She has never used smokeless tobacco. Ms. Kucinski reports current alcohol use.   Review of Systems CONSTITUTIONAL: No weight loss, fever, chills, weakness or fatigue.  HEENT: Eyes: No visual loss, blurred vision, double vision or yellow sclerae.No hearing loss, sneezing, congestion, runny nose or sore throat.  SKIN: No rash or itching.  CARDIOVASCULAR: per hpi RESPIRATORY: No shortness of breath, cough or sputum.  GASTROINTESTINAL: No anorexia, nausea, vomiting or diarrhea. No abdominal pain or blood.  GENITOURINARY: No burning on urination, no polyuria NEUROLOGICAL: No headache, dizziness, syncope, paralysis, ataxia, numbness or tingling in the extremities. No change in bowel or bladder control.  MUSCULOSKELETAL: No muscle, back pain, joint pain or stiffness.  LYMPHATICS: No enlarged nodes. No history of splenectomy.  PSYCHIATRIC: No history of depression or anxiety.  ENDOCRINOLOGIC: No reports of sweating, cold or heat intolerance. No polyuria or polydipsia.  Marland Kitchen   Physical Examination Today's Vitals   10/07/21 1533  BP: 116/82  Pulse: (!) 105  SpO2: 97%  Weight: 204 lb  (92.5 kg)  Height: 5\' 5"  (1.651 m)   Body mass index is 33.95 kg/m.  Gen: resting comfortably, no acute distress HEENT: no scleral icterus, pupils equal round and reactive, no palptable cervical adenopathy,  CV: RRR, no m/r/g no jvd Resp: Clear to auscultation bilaterally GI: abdomen is soft, non-tender, non-distended, normal bowel sounds, no hepatosplenomegaly MSK: extremities are warm, no edema.  Skin: warm, no rash Neuro:  no focal deficits Psych: appropriate affect   Diagnostic Studies  05/2019 echo 1. The left ventricle has normal systolic function, with an ejection  fraction of 55-60%. The cavity size was normal. Left ventricular diastolic  Doppler parameters are consistent with impaired relaxation. No evidence of  left ventricular regional wall  motion abnormalities.   2. The right ventricle has normal systolic function. The cavity was  normal. There is no increase in right ventricular wall thickness. Right  ventricular systolic pressure is normal with an estimated pressure of 29.6  mmHg.   3. The aorta is normal unless otherwise noted.    10/2011 echo: LVEF 63-87%, systolic doming pulmonary valve with mild PS    Assessment and Plan  1.Chest pain - no significant CAD risk factors, fairly atypical symptoms though unclear etiology - we discussed possible a stress test vs montioring at this time, will monitor for any significant recurrence. If had stress would not be able to exercise due to orthostatic symptoms  2. Orthostatic dizziness - orthostatic vital signs from recent ER visit is consistent with POTs, symptoms are consisrent - pcp has already started mididodrine - will give Rx for compression stockings, encouraged aggressive oral hydration and sodium intake - she will update [place] in 1 week on symptoms.       Arnoldo Lenis, M.D.

## 2021-10-07 NOTE — Telephone Encounter (Signed)
Spoke with pt, she was seen in the ER yesterday due to chest pain. They found she was dehydrated and gave her fluid but she continues to have a heavy feeling in the center of her chest. She also reports her heart rate goes up to 131-128 bpm when she stands. Her current bp is 102/83 and heart rate is 115 bpm. She reports it is had to take a deep breath, not due to pain. The pain in her chest feels like it is stuck and will not go anywhere. She reports dizziness most of the time and it can get worse with walking or movement. She has not taken any midodrine and reports drinking plenty of fluids. She is wanting to change to dr branch in the [place] office for convenience. Patient scheduled to see dr branch in the [place] office this afternoon.

## 2021-10-07 NOTE — Patient Instructions (Addendum)
Medication Instructions:  Continue all current medications.  Labwork: none  Testing/Procedures: none  Follow-Up: 4 months    Any Other Special Instructions Will Be Listed Below (If Applicable). Compression stockings order given today.   If you need a refill on your cardiac medications before your next appointment, please call your pharmacy.

## 2021-10-07 NOTE — Telephone Encounter (Signed)
Pt c/o of Chest Pain: STAT if CP now or developed within 24 hours  1. Are you having CP right now?  No   2. Are you experiencing any other symptoms (ex. SOB, nausea, vomiting, sweating)?  SOB, dizziness  3. How long have you been experiencing CP?  Started yesterday, 2/06  4. Is your CP continuous or coming and going?  Coming and going  5. Have you taken Nitroglycerin?  No     STAT if HR is under 50 or over 120 (normal HR is 60-100 beats per minute)  What is your heart rate?  127  Do you have a log of your heart rate readings (document readings)?  No, but patient states yesterday she went to the ED for an evaluation and her HR increased about 30 points from sitting to standing.  Do you have any other symptoms?  Dizziness, SOB   ?

## 2021-11-19 DIAGNOSIS — M546 Pain in thoracic spine: Secondary | ICD-10-CM | POA: Diagnosis not present

## 2021-11-19 DIAGNOSIS — M542 Cervicalgia: Secondary | ICD-10-CM | POA: Diagnosis not present

## 2021-11-19 DIAGNOSIS — M9902 Segmental and somatic dysfunction of thoracic region: Secondary | ICD-10-CM | POA: Diagnosis not present

## 2021-11-19 DIAGNOSIS — M9901 Segmental and somatic dysfunction of cervical region: Secondary | ICD-10-CM | POA: Diagnosis not present

## 2021-11-26 DIAGNOSIS — M9902 Segmental and somatic dysfunction of thoracic region: Secondary | ICD-10-CM | POA: Diagnosis not present

## 2021-11-26 DIAGNOSIS — M546 Pain in thoracic spine: Secondary | ICD-10-CM | POA: Diagnosis not present

## 2021-11-26 DIAGNOSIS — M9901 Segmental and somatic dysfunction of cervical region: Secondary | ICD-10-CM | POA: Diagnosis not present

## 2021-11-26 DIAGNOSIS — M542 Cervicalgia: Secondary | ICD-10-CM | POA: Diagnosis not present

## 2021-12-01 DIAGNOSIS — Q828 Other specified congenital malformations of skin: Secondary | ICD-10-CM | POA: Diagnosis not present

## 2021-12-22 DIAGNOSIS — M542 Cervicalgia: Secondary | ICD-10-CM | POA: Diagnosis not present

## 2021-12-22 DIAGNOSIS — M9901 Segmental and somatic dysfunction of cervical region: Secondary | ICD-10-CM | POA: Diagnosis not present

## 2021-12-22 DIAGNOSIS — M546 Pain in thoracic spine: Secondary | ICD-10-CM | POA: Diagnosis not present

## 2021-12-22 DIAGNOSIS — M9902 Segmental and somatic dysfunction of thoracic region: Secondary | ICD-10-CM | POA: Diagnosis not present

## 2021-12-24 DIAGNOSIS — M546 Pain in thoracic spine: Secondary | ICD-10-CM | POA: Diagnosis not present

## 2021-12-24 DIAGNOSIS — M542 Cervicalgia: Secondary | ICD-10-CM | POA: Diagnosis not present

## 2021-12-24 DIAGNOSIS — M9902 Segmental and somatic dysfunction of thoracic region: Secondary | ICD-10-CM | POA: Diagnosis not present

## 2021-12-24 DIAGNOSIS — M9901 Segmental and somatic dysfunction of cervical region: Secondary | ICD-10-CM | POA: Diagnosis not present

## 2021-12-26 DIAGNOSIS — M546 Pain in thoracic spine: Secondary | ICD-10-CM | POA: Diagnosis not present

## 2021-12-26 DIAGNOSIS — M9902 Segmental and somatic dysfunction of thoracic region: Secondary | ICD-10-CM | POA: Diagnosis not present

## 2021-12-26 DIAGNOSIS — M542 Cervicalgia: Secondary | ICD-10-CM | POA: Diagnosis not present

## 2021-12-26 DIAGNOSIS — M9901 Segmental and somatic dysfunction of cervical region: Secondary | ICD-10-CM | POA: Diagnosis not present

## 2021-12-29 DIAGNOSIS — M9902 Segmental and somatic dysfunction of thoracic region: Secondary | ICD-10-CM | POA: Diagnosis not present

## 2021-12-29 DIAGNOSIS — M542 Cervicalgia: Secondary | ICD-10-CM | POA: Diagnosis not present

## 2021-12-29 DIAGNOSIS — M9901 Segmental and somatic dysfunction of cervical region: Secondary | ICD-10-CM | POA: Diagnosis not present

## 2021-12-29 DIAGNOSIS — M546 Pain in thoracic spine: Secondary | ICD-10-CM | POA: Diagnosis not present

## 2022-01-02 DIAGNOSIS — M9902 Segmental and somatic dysfunction of thoracic region: Secondary | ICD-10-CM | POA: Diagnosis not present

## 2022-01-02 DIAGNOSIS — M546 Pain in thoracic spine: Secondary | ICD-10-CM | POA: Diagnosis not present

## 2022-01-02 DIAGNOSIS — M542 Cervicalgia: Secondary | ICD-10-CM | POA: Diagnosis not present

## 2022-01-02 DIAGNOSIS — M9901 Segmental and somatic dysfunction of cervical region: Secondary | ICD-10-CM | POA: Diagnosis not present

## 2022-01-05 DIAGNOSIS — M9901 Segmental and somatic dysfunction of cervical region: Secondary | ICD-10-CM | POA: Diagnosis not present

## 2022-01-05 DIAGNOSIS — M542 Cervicalgia: Secondary | ICD-10-CM | POA: Diagnosis not present

## 2022-01-05 DIAGNOSIS — M546 Pain in thoracic spine: Secondary | ICD-10-CM | POA: Diagnosis not present

## 2022-01-05 DIAGNOSIS — M9902 Segmental and somatic dysfunction of thoracic region: Secondary | ICD-10-CM | POA: Diagnosis not present

## 2022-01-09 DIAGNOSIS — M9901 Segmental and somatic dysfunction of cervical region: Secondary | ICD-10-CM | POA: Diagnosis not present

## 2022-01-09 DIAGNOSIS — M542 Cervicalgia: Secondary | ICD-10-CM | POA: Diagnosis not present

## 2022-01-09 DIAGNOSIS — M546 Pain in thoracic spine: Secondary | ICD-10-CM | POA: Diagnosis not present

## 2022-01-09 DIAGNOSIS — M9902 Segmental and somatic dysfunction of thoracic region: Secondary | ICD-10-CM | POA: Diagnosis not present

## 2022-01-12 DIAGNOSIS — M9901 Segmental and somatic dysfunction of cervical region: Secondary | ICD-10-CM | POA: Diagnosis not present

## 2022-01-12 DIAGNOSIS — M542 Cervicalgia: Secondary | ICD-10-CM | POA: Diagnosis not present

## 2022-01-12 DIAGNOSIS — M546 Pain in thoracic spine: Secondary | ICD-10-CM | POA: Diagnosis not present

## 2022-01-12 DIAGNOSIS — M9902 Segmental and somatic dysfunction of thoracic region: Secondary | ICD-10-CM | POA: Diagnosis not present

## 2022-01-16 DIAGNOSIS — M542 Cervicalgia: Secondary | ICD-10-CM | POA: Diagnosis not present

## 2022-01-16 DIAGNOSIS — M9902 Segmental and somatic dysfunction of thoracic region: Secondary | ICD-10-CM | POA: Diagnosis not present

## 2022-01-16 DIAGNOSIS — M546 Pain in thoracic spine: Secondary | ICD-10-CM | POA: Diagnosis not present

## 2022-01-16 DIAGNOSIS — M9901 Segmental and somatic dysfunction of cervical region: Secondary | ICD-10-CM | POA: Diagnosis not present

## 2022-01-23 DIAGNOSIS — M546 Pain in thoracic spine: Secondary | ICD-10-CM | POA: Diagnosis not present

## 2022-01-23 DIAGNOSIS — M9902 Segmental and somatic dysfunction of thoracic region: Secondary | ICD-10-CM | POA: Diagnosis not present

## 2022-01-23 DIAGNOSIS — M542 Cervicalgia: Secondary | ICD-10-CM | POA: Diagnosis not present

## 2022-01-23 DIAGNOSIS — M9901 Segmental and somatic dysfunction of cervical region: Secondary | ICD-10-CM | POA: Diagnosis not present

## 2022-01-30 DIAGNOSIS — M9901 Segmental and somatic dysfunction of cervical region: Secondary | ICD-10-CM | POA: Diagnosis not present

## 2022-01-30 DIAGNOSIS — M9902 Segmental and somatic dysfunction of thoracic region: Secondary | ICD-10-CM | POA: Diagnosis not present

## 2022-01-30 DIAGNOSIS — M542 Cervicalgia: Secondary | ICD-10-CM | POA: Diagnosis not present

## 2022-01-30 DIAGNOSIS — M546 Pain in thoracic spine: Secondary | ICD-10-CM | POA: Diagnosis not present

## 2022-02-05 ENCOUNTER — Ambulatory Visit (INDEPENDENT_AMBULATORY_CARE_PROVIDER_SITE_OTHER): Payer: BC Managed Care – PPO | Admitting: Cardiology

## 2022-02-05 ENCOUNTER — Encounter: Payer: Self-pay | Admitting: *Deleted

## 2022-02-05 ENCOUNTER — Encounter: Payer: Self-pay | Admitting: Cardiology

## 2022-02-05 ENCOUNTER — Telehealth: Payer: Self-pay | Admitting: Cardiology

## 2022-02-05 VITALS — BP 118/70 | HR 80 | Ht 65.0 in | Wt 205.8 lb

## 2022-02-05 DIAGNOSIS — R42 Dizziness and giddiness: Secondary | ICD-10-CM | POA: Diagnosis not present

## 2022-02-05 DIAGNOSIS — R0789 Other chest pain: Secondary | ICD-10-CM

## 2022-02-05 DIAGNOSIS — R079 Chest pain, unspecified: Secondary | ICD-10-CM | POA: Diagnosis not present

## 2022-02-05 MED ORDER — MIDODRINE HCL 5 MG PO TABS: 5.0000 mg | ORAL_TABLET | Freq: Three times a day (TID) | ORAL | 3 refills | Status: DC

## 2022-02-05 NOTE — Progress Notes (Signed)
Clinical Summary Lisa Garner is a 51 y.o.female seen today for follow up of the following medical problems.   1.Chest pain - ER visit yesterday with chest pain - Trop neg x 2. EKG SR, no acute ischemic changes. CXR no acute process   - episode started yesterday AM. Pressure midchest, 6-7/10 in severity. Pain into right shoulder. Not positional. No relation to food. Pain lasted roughty 4-5 hours.     Some recurrent chest pain over the last week.  - midchest, pressure. Worst with deep breathing. No relation to food. Pain/pressure started in the evening. Symptoms can last few hours at time. Compliant with meds - no exertional symptoms. 7/10 in severity.        2. Pulmonary valve stenosis -history of mild stenosis by prior echo       3.Orthostatic Dizziness - long history of symptoms - symptoms Aug 2020 - episode of severe dizziness, nausea, vomiting  - low bp's at that time, 90s/60s - symptoms started about 1 month. Symptoms with standing for 5-10 minutes. Feels hot, starts to feel dizzy and heart increase, headache. Will sit down and feels better   - cup of coffee in AM, 1-2 32 oz, dinner diet Dr pepper - started midodrine, mixed compliance thus far - previously had florinef in the past, no side effects but ended up stopping.  - 10/2021 ER visit: HR increased 70-->109 with standing. No significant bp changes - amitryptine can cause orthostatic hypotension but timing does not seem to line up   - some ongoing episodes. Dizziness in any position, pulsating in head. - notices HRs to 116 with walking.      4. Chronic GERD, history of Gastritis Past Medical History:  Diagnosis Date   Claudication [facility])    Lower arterial duplex scan 01/05/08 - Normal   Congenital pulmonary stenosis    mild   Fatty liver    GERD (gastroesophageal reflux disease)    Helicobacter pylori gastritis 2005   IBS (irritable bowel syndrome)    Pulmonary hypertension (HCC)    TTE 10/23/11 - LV  size normal. LV EF 50-55%   S/P endoscopy 05/25/08   Dr Maryclare Bean small bowel biopsy, reactive gastropathy   Skin disorder      Allergies  Allergen Reactions   Gluten Meal     Abdominal cramping   Levbid [Hyoscyamine Sulfate] Rash     Current Outpatient Medications  Medication Sig Dispense Refill   acitretin (SORIATANE) 10 MG capsule Take 10 mg by mouth daily before breakfast.     amitriptyline (ELAVIL) 10 MG tablet TAKE 2 TABLETs BY MOUTH AT BEDTIME. 180 tablet 3   ascorbic acid (VITAMIN C) 500 MG tablet Take 500 mg by mouth daily.     Biotin w/ Vitamins C & E (HAIR/SKIN/NAILS PO) Take 3 tablets by mouth daily.     Calcium Citrate-Vitamin D (CALCIUM + D PO) Take 2 tablets by mouth daily.     Cholecalciferol (DIALYVITE VITAMIN D 5000) 125 MCG (5000 UT) capsule Take 5,000 Units by mouth daily.     ibuprofen (ADVIL) 800 MG tablet Take 800 mg by mouth at bedtime as needed for moderate pain.      levocetirizine (XYZAL) 5 MG tablet Take 5 mg by mouth daily.     magnesium gluconate (MAGONATE) 500 MG tablet Take 500 mg by mouth daily.     meloxicam (MOBIC) 15 MG tablet Take 15 mg by mouth daily as needed for pain.  midodrine (PROAMATINE) 5 MG tablet Take 2.5 mg by mouth 3 (three) times daily.     ondansetron (ZOFRAN ODT) 4 MG disintegrating tablet Take 1 tablet (4 mg total) by mouth every 8 (eight) hours as needed for nausea or vomiting. 30 tablet 3   pantoprazole (PROTONIX) 40 MG tablet TAKE ONE TABLET BY MOUTH ONCE DAILY. 90 tablet 3   progesterone (PROMETRIUM) 200 MG capsule Take 200 mg by mouth at bedtime.     No current facility-administered medications for this visit.     Past Surgical History:  Procedure Laterality Date   arm surgery     BIOPSY  04/19/2020   Procedure: BIOPSY;  Surgeon: [name], DO;  [facility];  Service: Endoscopy;;  duodenal   CHOLECYSTECTOMY  1997   ERCP with sphincterotomy/stone extraction   COLONOSCOPY N/A 01/18/2015   2  colon polyps, examined TI normal. Small internal hemorrhoids. Tubular adenoma   COLONOSCOPY WITH PROPOFOL N/A 04/19/2020   Non-bleeding internal hemorrhoids, TI normal. 7 years surveillance   DILATION AND CURETTAGE OF UTERUS     ENDOMETRIAL ABLATION     ESOPHAGOGASTRODUODENOSCOPY  05/25/2008   VHQ:IONGEX small bowel    ESOPHAGOGASTRODUODENOSCOPY N/A 01/18/2015   multiple gastric polyps in gastric fundus and cardiac, mild non-erosive gastritis. No Hpylori    ESOPHAGOGASTRODUODENOSCOPY (EGD) WITH PROPOFOL N/A 04/19/2020   Gastritis, multiple gastric polyps, normal duodenum. Negative villous changes. Negative H.pylori.    KNEE SURGERY Left 02/1989   TONSILLECTOMY     WISDOM TOOTH EXTRACTION  11/1989     Allergies  Allergen Reactions   Gluten Meal     Abdominal cramping   Levbid [Hyoscyamine Sulfate] Rash      Family History  Problem Relation Age of Onset   Celiac disease Father        [place]   Leukemia Father    Colon polyps Mother    Hyperlipidemia Mother    Hyperlipidemia Sister    Colon cancer Neg Hx      Social History Lisa Garner reports that she has never smoked. She has never used smokeless tobacco. Lisa Garner reports current alcohol use.   Review of Systems CONSTITUTIONAL: No weight loss, fever, chills, weakness or fatigue.  HEENT: Eyes: No visual loss, blurred vision, double vision or yellow sclerae.No hearing loss, sneezing, congestion, runny nose or sore throat.  SKIN: No rash or itching.  CARDIOVASCULAR: per hpi RESPIRATORY: No shortness of breath, cough or sputum.  GASTROINTESTINAL: No anorexia, nausea, vomiting or diarrhea. No abdominal pain or blood.  GENITOURINARY: No burning on urination, no polyuria NEUROLOGICAL: No headache, dizziness, syncope, paralysis, ataxia, numbness or tingling in the extremities. No change in bowel or bladder control.  MUSCULOSKELETAL: No muscle, back pain, joint pain or stiffness.  LYMPHATICS: No enlarged nodes.  No history of splenectomy.  PSYCHIATRIC: No history of depression or anxiety.  ENDOCRINOLOGIC: No reports of sweating, cold or heat intolerance. No polyuria or polydipsia.  Marland Kitchen   Physical Examination Today's Vitals   02/05/22 0819  BP: 118/70  Pulse: 80  SpO2: 99%  Weight: 205 lb 12.8 oz (93.4 kg)  Height: '5\' 5"'$  (1.651 m)   Body mass index is 34.25 kg/m.  Gen: resting comfortably, no acute distress HEENT: no scleral icterus, pupils equal round and reactive, no palptable cervical adenopathy,  CV: RRR, no m/r/g no jvd Resp: Clear to auscultation bilaterally GI: abdomen is soft, non-tender, non-distended, normal bowel sounds, no hepatosplenomegaly MSK: extremities are warm, no edema.  Skin:  warm, no rash Neuro:  no focal deficits Psych: appropriate affect   Diagnostic Studies 05/2019 echo 1. The left ventricle has normal systolic function, with an ejection  fraction of 55-60%. The cavity size was normal. Left ventricular diastolic  Doppler parameters are consistent with impaired relaxation. No evidence of  left ventricular regional wall  motion abnormalities.   2. The right ventricle has normal systolic function. The cavity was  normal. There is no increase in right ventricular wall thickness. Right  ventricular systolic pressure is normal with an estimated pressure of 29.6  mmHg.   3. The aorta is normal unless otherwise noted.      10/2011 echo: LVEF 73-71%, systolic doming pulmonary valve with mild PS      Assessment and Plan   1.Chest pain - recurring chest pains since our last visti - EKG today shows NSR, no ischemic changes - plan for lexiscan to further evaluate   2. Orthostatic dizziness - orthostatic vital signs from recent ER visit is consistent with POTs, symptoms are consisrent - pcp has already started mididodrine - some ongoing symptoms, increase midodrine to '5mg'$  tid - she has compression stockings, working on aggressive hydration.      Arnoldo Lenis, M.D.

## 2022-02-05 NOTE — Patient Instructions (Addendum)
Medication Instructions:  Your physician has recommended you make the following change in your medication:  Increase midodrine to 5 mg three times daily Continue other medications the same  Labwork: none  Testing/Procedures: Your physician has requested that you have a lexiscan myoview. For further information please visit [email or web address]. Please follow instruction sheet, as given.  Follow-Up: Your physician recommends that you schedule a follow-up appointment in: pending  Any Other Special Instructions Will Be Listed Below (If Applicable).  If you need a refill on your cardiac medications before your next appointment, please call your pharmacy.

## 2022-02-05 NOTE — Telephone Encounter (Signed)
Checking percert on the following patient for testing scheduled at [place].    LEXISCAN  02/10/2022 

## 2022-02-06 DIAGNOSIS — M542 Cervicalgia: Secondary | ICD-10-CM | POA: Diagnosis not present

## 2022-02-06 DIAGNOSIS — M9902 Segmental and somatic dysfunction of thoracic region: Secondary | ICD-10-CM | POA: Diagnosis not present

## 2022-02-06 DIAGNOSIS — M546 Pain in thoracic spine: Secondary | ICD-10-CM | POA: Diagnosis not present

## 2022-02-06 DIAGNOSIS — M9901 Segmental and somatic dysfunction of cervical region: Secondary | ICD-10-CM | POA: Diagnosis not present

## 2022-02-10 ENCOUNTER — Encounter (HOSPITAL_BASED_OUTPATIENT_CLINIC_OR_DEPARTMENT_OTHER)
Admission: RE | Admit: 2022-02-10 | Discharge: 2022-02-10 | Disposition: A | Discharge status: Home / Self Care | Payer: BC Managed Care – PPO | Source: Ambulatory Visit | Attending: Cardiology | Admitting: Cardiology

## 2022-02-10 ENCOUNTER — Encounter (HOSPITAL_COMMUNITY): Payer: Self-pay

## 2022-02-10 ENCOUNTER — Ambulatory Visit (HOSPITAL_COMMUNITY)
Admission: RE | Admit: 2022-02-10 | Discharge: 2022-02-10 | Disposition: A | Discharge status: Home / Self Care | Payer: BC Managed Care – PPO | Source: Ambulatory Visit | Attending: Cardiology | Admitting: Cardiology

## 2022-02-10 DIAGNOSIS — R079 Chest pain, unspecified: Secondary | ICD-10-CM

## 2022-02-10 LAB — NM MYOCAR MULTI W/SPECT W/WALL MOTION / EF
LV dias vol: 59 mL (ref 46–106)
LV sys vol: 18 mL
Nuc Stress EF: 69 %
Peak HR: 115 {beats}/min
RATE: 0.6
Rest HR: 75 {beats}/min
Rest Nuclear Isotope Dose: 10.4 mCi
SDS: 0
SRS: 0
SSS: 0
ST Depression (mm): 0 mm
Stress Nuclear Isotope Dose: 32 mCi
TID: 1.01

## 2022-02-10 MED ORDER — SODIUM CHLORIDE FLUSH 0.9 % IV SOLN
INTRAVENOUS | Status: AC
  Administered 2022-02-10: 10 mL via INTRAVENOUS
  Filled 2022-02-10: qty 10

## 2022-02-10 MED ORDER — TECHNETIUM TC 99M TETROFOSMIN IV KIT
10.0000 | PACK | Freq: Once | INTRAVENOUS | Status: AC | PRN
  Administered 2022-02-10: 10.4 via INTRAVENOUS

## 2022-02-10 MED ORDER — TECHNETIUM TC 99M TETROFOSMIN IV KIT
30.0000 | PACK | Freq: Once | INTRAVENOUS | Status: AC | PRN
  Administered 2022-02-10: 32 via INTRAVENOUS

## 2022-02-10 MED ORDER — REGADENOSON 0.4 MG/5ML IV SOLN
INTRAVENOUS | Status: AC
  Administered 2022-02-10: 0.4 mg via INTRAVENOUS
  Filled 2022-02-10: qty 5

## 2022-02-12 ENCOUNTER — Telehealth: Payer: Self-pay

## 2022-02-12 NOTE — Telephone Encounter (Signed)
Patient notified and verbalized understanding. Patient had no questions or concerns at this time. PCP copied 

## 2022-02-12 NOTE — Telephone Encounter (Signed)
-----   Message from Arnoldo Lenis, MD sent at 02/11/2022  4:24 PM EDT ----- Normal stress test, no evidence of blockages. SHould f/u with pcp to discuss noncardiac causes of symptoms. Can f/u with [place] 6 months   Zandra Abts MD

## 2022-02-16 ENCOUNTER — Other Ambulatory Visit: Payer: Self-pay | Admitting: Gastroenterology

## 2022-02-16 DIAGNOSIS — R109 Unspecified abdominal pain: Secondary | ICD-10-CM

## 2022-02-18 DIAGNOSIS — M9902 Segmental and somatic dysfunction of thoracic region: Secondary | ICD-10-CM | POA: Diagnosis not present

## 2022-02-18 DIAGNOSIS — M546 Pain in thoracic spine: Secondary | ICD-10-CM | POA: Diagnosis not present

## 2022-02-18 DIAGNOSIS — M542 Cervicalgia: Secondary | ICD-10-CM | POA: Diagnosis not present

## 2022-02-18 DIAGNOSIS — M9901 Segmental and somatic dysfunction of cervical region: Secondary | ICD-10-CM | POA: Diagnosis not present

## 2022-03-16 ENCOUNTER — Encounter: Payer: Self-pay | Admitting: Internal Medicine

## 2022-03-18 DIAGNOSIS — M9901 Segmental and somatic dysfunction of cervical region: Secondary | ICD-10-CM | POA: Diagnosis not present

## 2022-03-18 DIAGNOSIS — M542 Cervicalgia: Secondary | ICD-10-CM | POA: Diagnosis not present

## 2022-03-18 DIAGNOSIS — M9902 Segmental and somatic dysfunction of thoracic region: Secondary | ICD-10-CM | POA: Diagnosis not present

## 2022-03-18 DIAGNOSIS — M546 Pain in thoracic spine: Secondary | ICD-10-CM | POA: Diagnosis not present

## 2022-03-25 DIAGNOSIS — Z79899 Other long term (current) drug therapy: Secondary | ICD-10-CM | POA: Diagnosis not present

## 2022-03-25 DIAGNOSIS — Q828 Other specified congenital malformations of skin: Secondary | ICD-10-CM | POA: Diagnosis not present

## 2022-04-15 DIAGNOSIS — M9901 Segmental and somatic dysfunction of cervical region: Secondary | ICD-10-CM | POA: Diagnosis not present

## 2022-04-15 DIAGNOSIS — M542 Cervicalgia: Secondary | ICD-10-CM | POA: Diagnosis not present

## 2022-04-15 DIAGNOSIS — M9902 Segmental and somatic dysfunction of thoracic region: Secondary | ICD-10-CM | POA: Diagnosis not present

## 2022-04-15 DIAGNOSIS — M546 Pain in thoracic spine: Secondary | ICD-10-CM | POA: Diagnosis not present

## 2022-07-09 DIAGNOSIS — Z79899 Other long term (current) drug therapy: Secondary | ICD-10-CM | POA: Diagnosis not present

## 2022-07-09 DIAGNOSIS — L870 Keratosis follicularis et parafollicularis in cutem penetrans: Secondary | ICD-10-CM | POA: Diagnosis not present

## 2022-07-14 ENCOUNTER — Other Ambulatory Visit (HOSPITAL_COMMUNITY): Payer: Self-pay

## 2022-07-14 MED ORDER — TRETINOIN 0.05 % EX CREA
TOPICAL_CREAM | CUTANEOUS | 1 refills | Status: AC
Start: 2022-07-09 — End: ?
  Filled 2022-07-14: qty 45, 30d supply, fill #0

## 2022-07-14 MED ORDER — ACITRETIN 10 MG PO CAPS
10.0000 mg | ORAL_CAPSULE | Freq: Every day | ORAL | 3 refills | Status: AC
Start: 2022-07-09 — End: ?
  Filled 2022-07-31: qty 30, 30d supply, fill #0
  Filled 2022-08-29: qty 30, 30d supply, fill #1
  Filled 2022-10-05: qty 30, 30d supply, fill #2
  Filled 2022-11-23: qty 30, 30d supply, fill #3

## 2022-07-24 ENCOUNTER — Other Ambulatory Visit (HOSPITAL_COMMUNITY): Payer: Self-pay

## 2022-07-31 ENCOUNTER — Other Ambulatory Visit (HOSPITAL_COMMUNITY): Payer: Self-pay

## 2022-08-03 ENCOUNTER — Other Ambulatory Visit (HOSPITAL_COMMUNITY): Payer: Self-pay

## 2022-08-10 ENCOUNTER — Ambulatory Visit: Payer: BC Managed Care – PPO | Admitting: Cardiology

## 2022-08-15 DIAGNOSIS — I959 Hypotension, unspecified: Secondary | ICD-10-CM | POA: Diagnosis not present

## 2022-08-15 DIAGNOSIS — Z0000 Encounter for general adult medical examination without abnormal findings: Secondary | ICD-10-CM | POA: Diagnosis not present

## 2022-08-15 DIAGNOSIS — E782 Mixed hyperlipidemia: Secondary | ICD-10-CM | POA: Diagnosis not present

## 2022-08-15 DIAGNOSIS — K219 Gastro-esophageal reflux disease without esophagitis: Secondary | ICD-10-CM | POA: Diagnosis not present

## 2022-08-29 ENCOUNTER — Other Ambulatory Visit (HOSPITAL_COMMUNITY): Payer: Self-pay

## 2022-09-01 ENCOUNTER — Other Ambulatory Visit (HOSPITAL_COMMUNITY): Payer: Self-pay

## 2022-09-02 ENCOUNTER — Other Ambulatory Visit (HOSPITAL_COMMUNITY): Payer: Self-pay

## 2022-09-07 DIAGNOSIS — Z6834 Body mass index (BMI) 34.0-34.9, adult: Secondary | ICD-10-CM | POA: Diagnosis not present

## 2022-09-07 DIAGNOSIS — J019 Acute sinusitis, unspecified: Secondary | ICD-10-CM | POA: Diagnosis not present

## 2022-09-07 DIAGNOSIS — E669 Obesity, unspecified: Secondary | ICD-10-CM | POA: Diagnosis not present

## 2022-09-08 ENCOUNTER — Encounter: Payer: Self-pay | Admitting: Nurse Practitioner

## 2022-09-08 ENCOUNTER — Ambulatory Visit: Payer: BC Managed Care – PPO | Attending: Cardiology | Admitting: Nurse Practitioner

## 2022-09-08 VITALS — BP 118/84 | HR 96 | Ht 64.0 in | Wt 202.0 lb

## 2022-09-08 DIAGNOSIS — R000 Tachycardia, unspecified: Secondary | ICD-10-CM

## 2022-09-08 DIAGNOSIS — R42 Dizziness and giddiness: Secondary | ICD-10-CM | POA: Diagnosis not present

## 2022-09-08 DIAGNOSIS — Q256 Stenosis of pulmonary artery: Secondary | ICD-10-CM

## 2022-09-08 DIAGNOSIS — I370 Nonrheumatic pulmonary valve stenosis: Secondary | ICD-10-CM

## 2022-09-08 DIAGNOSIS — E669 Obesity, unspecified: Secondary | ICD-10-CM

## 2022-09-08 NOTE — Progress Notes (Unsigned)
Cardiology Office Note:    Date:  09/08/2022  ID:  Lisa Garner, DOB 01-05-1971, MRN [id number]  PCP:  Celene Squibb, MD   [place] [facility] Cardiologist:  Carlyle Dolly, MD     Referring MD: Celene Squibb, MD   CC: Here for follow-up  History of Present Illness:    Lisa Garner is a 52 y.o. female with a hx of the following:  Chest pain Orthostatic dizziness Pulmonary valve stenosis Chronic GERD  Patient is a very pleasant 52 year old female with past medical history as mentioned above.  Echocardiogram in 2020 was normal, no significant valvular abnormalities.  Previously seen in ED for chest pain, workup was unremarkable.  Longstanding history of orthostatic dizziness, started around 2020.  Orthostatic vital signs from ED visit in 2023 were consistent with POTS as well as symptoms.  Was started on midodrine by PCP, and was given Rx for compression stockings.  Was encouraged to stay well-hydrated and get adequate salt intake.  Last seen by Dr. Carlyle Dolly on February 05, 2022.  Patient noted recurrent chest pain over the past week, worse with breathing, located mid chest.  Symptoms could last few hours at a time, denied any exertional chest pain.  Was compliant with her medications.  She noted ongoing symptoms of orthostatic dizziness, heart rate up to 116 with walking.  EKG was reassuring in office, negative for acute ischemic changes.  Midodrine was increased to 5 mg 3 times daily.  Underwent Lexi scan that was normal, low risk.  Was told to follow-up in 6 months.  Today she presents for follow-up.  She states she is doing well. Denies any CP, SHOB, palpitations, orthopnea, PND, syncope, presyncope, swelling or acute weight changes, acute bleeding, or claudication.  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  Works as a Education administrator.  Says she is going to see her OB/GYN if she feels like she  is going through hormonal changes.  Denies any other questions or concerns today.  Past Medical History:  Diagnosis Date   Claudication [facility])    Lower arterial duplex scan 01/05/08 - Normal   Congenital pulmonary stenosis    mild   Fatty liver    GERD (gastroesophageal reflux disease)    Helicobacter pylori gastritis 2005   IBS (irritable bowel syndrome)    Pulmonary hypertension (HCC)    TTE 10/23/11 - LV size normal. LV EF 50-55%   S/P endoscopy 05/25/08   Dr Maryclare Bean small bowel biopsy, reactive gastropathy   Skin disorder     Past Surgical History:  Procedure Laterality Date   arm surgery     BIOPSY  04/19/2020   Procedure: BIOPSY;  Surgeon: [name], DO;  [facility];  Service: Endoscopy;;  duodenal   CHOLECYSTECTOMY  1997   ERCP with sphincterotomy/stone extraction   COLONOSCOPY N/A 01/18/2015   2 colon polyps, examined TI normal. Small internal hemorrhoids. Tubular adenoma   COLONOSCOPY WITH PROPOFOL N/A 04/19/2020   Non-bleeding internal hemorrhoids, TI normal. 7 years surveillance   DILATION AND CURETTAGE OF UTERUS     ENDOMETRIAL ABLATION     ESOPHAGOGASTRODUODENOSCOPY  05/25/2008   WLS:LHTDSK small bowel    ESOPHAGOGASTRODUODENOSCOPY N/A 01/18/2015   multiple gastric polyps in gastric fundus and cardiac, mild non-erosive gastritis. No Hpylori    ESOPHAGOGASTRODUODENOSCOPY (EGD) WITH PROPOFOL N/A 04/19/2020   Gastritis, multiple gastric polyps, normal duodenum. Negative  villous changes. Negative H.pylori.    KNEE SURGERY Left 02/1989   TONSILLECTOMY     WISDOM TOOTH EXTRACTION  11/1989    Current Medications: Current Meds  Medication Sig   acitretin (SORIATANE) 10 MG capsule Take 10 mg by mouth daily before breakfast.   acitretin (SORIATANE) 10 MG capsule Take 1 capsule (10 mg total) by mouth daily.   amitriptyline (ELAVIL) 10 MG tablet TAKE (2) TABLETS BY MOUTH AT BEDTIME.   ascorbic acid (VITAMIN C) 500 MG tablet Take 500 mg by mouth daily.    Biotin w/ Vitamins C & E (HAIR/SKIN/NAILS PO) Take 3 tablets by mouth daily.   Calcium Citrate-Vitamin D (CALCIUM + D PO) Take 2 tablets by mouth daily.   Cholecalciferol (DIALYVITE VITAMIN D 5000) 125 MCG (5000 UT) capsule Take 5,000 Units by mouth daily.   ibuprofen (ADVIL) 800 MG tablet Take 800 mg by mouth at bedtime as needed for moderate pain.    levocetirizine (XYZAL) 5 MG tablet Take 5 mg by mouth daily.   magnesium gluconate (MAGONATE) 500 MG tablet Take 500 mg by mouth daily.   meloxicam (MOBIC) 15 MG tablet Take 15 mg by mouth daily as needed for pain.   midodrine (PROAMATINE) 5 MG tablet Take 1 tablet (5 mg total) by mouth 3 (three) times daily.   ondansetron (ZOFRAN ODT) 4 MG disintegrating tablet Take 1 tablet (4 mg total) by mouth every 8 (eight) hours as needed for nausea or vomiting.   pantoprazole (PROTONIX) 40 MG tablet TAKE ONE TABLET BY MOUTH ONCE DAILY.   progesterone (PROMETRIUM) 200 MG capsule Take 200 mg by mouth at bedtime.   tretinoin (RETIN-A) 0.05 % cream Apply BB size amount to areas on face at bedtime     Allergies:   Gluten meal and Levbid [hyoscyamine sulfate]   Social History   Socioeconomic History   Marital status: Married    Spouse name: Not on file   Number of children: 2   Years of education: Not on file   Highest education level: Not on file  Occupational History   Occupation: [employer]  Tobacco Use   Smoking status: Never   Smokeless tobacco: Never  Vaping Use   Vaping Use: Never used  Substance and Sexual Activity   Alcohol use: Yes    Alcohol/week: 0.0 standard drinks of alcohol    Comment: occ   Drug use: No   Sexual activity: Yes    Partners: Male    Comment: No birth control  Other Topics Concern   Not on file  Social History Narrative   Lives at home with husband.  He has two children.    Social Determinants of Health   Financial Resource Strain: Not on file  Food Insecurity: Not on file   Transportation Needs: Not on file  Physical Activity: Not on file  Stress: Not on file  Social Connections: Not on file     Family History: The patient's family history includes Celiac disease in her father; Colon polyps in her mother; Hyperlipidemia in her mother and sister; Leukemia in her father. There is no history of Colon cancer.  ROS:   Review of Systems  Constitutional: Negative.   HENT: Negative.    Eyes: Negative.   Respiratory: Negative.    Cardiovascular: Negative.   Gastrointestinal: Negative.   Genitourinary: Negative.   Musculoskeletal: Negative.   Skin: Negative.   Neurological: Negative.   Endo/Heme/Allergies: Negative.   Psychiatric/Behavioral: Negative.  Please see the history of present illness.    All other systems reviewed and are negative.  EKGs/Labs/Other Studies Reviewed:    The following studies were reviewed today:   EKG:  EKG is not ordered today.   Lexiscan on February 10, 2022:   LV perfusion is normal. There is no evidence of ischemia. There is no evidence of infarction.   Left ventricular function is normal. Nuclear stress EF: 69 %. The left ventricular ejection fraction is normal (55-65%). End diastolic cavity size is normal.   The study is normal. The study is low risk.  Echocardiogram on May 05, 2019:  1. The left ventricle has normal systolic function, with an ejection  fraction of 55-60%. The cavity size was normal. Left ventricular diastolic  Doppler parameters are consistent with impaired relaxation. No evidence of  left ventricular regional wall  motion abnormalities.   2. The right ventricle has normal systolic function. The cavity was  normal. There is no increase in right ventricular wall thickness. Right  ventricular systolic pressure is normal with an estimated pressure of 29.6  mmHg.   3. The aorta is normal unless otherwise noted.  Recent Labs: 10/06/2021: ALT 16; BUN 10; Creatinine, Ser 0.88; Hemoglobin 13.2;  Platelets 305; Potassium 4.0; Sodium 138  Recent Lipid Panel No results found for: "CHOL", "TRIG", "HDL", "CHOLHDL", "VLDL", "LDLCALC", "LDLDIRECT"   Physical Exam:    VS:  BP 118/84   Pulse 96   Ht '5\' 4"'$  (1.626 m)   Wt 202 lb (91.6 kg)   SpO2 100%   BMI 34.67 kg/m     Wt Readings from Last 3 Encounters:  09/08/22 202 lb (91.6 kg)  02/05/22 205 lb 12.8 oz (93.4 kg)  10/07/21 204 lb (92.5 kg)     GEN: Obese, 52 y.o. female in no acute distress HEENT: Normal NECK: No JVD; No carotid bruits CARDIAC: S1/S2, RRR, no murmurs, rubs, gallops; 2+ pulses RESPIRATORY:  Clear to auscultation without rales, wheezing or rhonchi  MUSCULOSKELETAL:  No edema; No deformity  SKIN: Warm and dry NEUROLOGIC:  Alert and oriented x 3 PSYCHIATRIC:  Normal affect   ASSESSMENT:    1. Pulmonary valve stenosis, unspecified etiology   2. Orthostatic dizziness   3. Tachycardia   4. Obesity (BMI 30-39.9)    PLAN:    In order of problems listed above:  Pulmonic valve stenosis Mild pulmonic valve stenosis noted in echocardiogram in 2013.  Repeat echo in 2020 did not mention pulmonic valve stenosis.  Will update echocardiogram at this time for evaluation. Heart healthy diet and regular cardiovascular exercise encouraged.   Orthostatic dizziness Does note improvement with symptoms since midodrine was increased to 5 mg 3 times daily.  Continue current medication regimen.  Discussed conservative measures and she verbalized understanding. Heart healthy diet and regular cardiovascular exercise encouraged.   Obesity BMI today 34.67. Weight loss via diet and exercise encouraged. Discussed the impact being overweight would have on cardiovascular risk. Heart healthy diet and regular cardiovascular exercise encouraged.   4. Tachycardia Does note increased HR at work with exertion, with heart rates up to 110. Resting HR today is 96. Discussed options for management; however patient would like to monitor this  for now. Heart healthy diet and regular cardiovascular exercise encouraged.     5.  Disposition: Follow-up with Dr. Carlyle Dolly in 6 months or sooner if anything changes. Recommend discussion of CT cardiac screening and lipid screening at next visit.    Medication Adjustments/Labs and  Tests Ordered: Current medicines are reviewed at length with the patient today.  Concerns regarding medicines are outlined above.  Orders Placed This Encounter  Procedures   ECHOCARDIOGRAM COMPLETE   No orders of the defined types were placed in this encounter.   Patient Instructions  Medication Instructions:  Your physician recommends that you continue on your current medications as directed. Please refer to the Current Medication list given to you today.   Labwork: None today  Testing/Procedures: Your physician has requested that you have an echocardiogram. Echocardiography is a painless test that uses sound waves to create images of your heart. It provides your doctor with information about the size and shape of your heart and how well your heart's chambers and valves are working. This procedure takes approximately one hour. There are no restrictions for this procedure. Please do NOT wear cologne, perfume, aftershave, or lotions (deodorant is allowed). Please arrive 15 minutes prior to your appointment time.   Follow-Up: 6 months Dr.Branch  Any Other Special Instructions Will Be Listed Below (If Applicable).  If you need a refill on your cardiac medications before your next appointment, please call your pharmacy.    Signed, Finis Bud, NP  09/09/2022 9:13 PM    [place]

## 2022-09-08 NOTE — Patient Instructions (Signed)
Medication Instructions:  Your physician recommends that you continue on your current medications as directed. Please refer to the Current Medication list given to you today.   Labwork: None today  Testing/Procedures: Your physician has requested that you have an echocardiogram. Echocardiography is a painless test that uses sound waves to create images of your heart. It provides your doctor with information about the size and shape of your heart and how well your heart's chambers and valves are working. This procedure takes approximately one hour. There are no restrictions for this procedure. Please do NOT wear cologne, perfume, aftershave, or lotions (deodorant is allowed). Please arrive 15 minutes prior to your appointment time.   Follow-Up: 6 months Dr.Branch  Any Other Special Instructions Will Be Listed Below (If Applicable).  If you need a refill on your cardiac medications before your next appointment, please call your pharmacy.

## 2022-09-10 ENCOUNTER — Other Ambulatory Visit (HOSPITAL_COMMUNITY): Payer: Self-pay

## 2022-09-18 ENCOUNTER — Ambulatory Visit (HOSPITAL_COMMUNITY)
Admission: RE | Admit: 2022-09-18 | Discharge: 2022-09-18 | Disposition: A | Discharge status: Home / Self Care | Payer: BC Managed Care – PPO | Source: Ambulatory Visit | Attending: Nurse Practitioner | Admitting: Nurse Practitioner

## 2022-09-18 DIAGNOSIS — I272 Pulmonary hypertension, unspecified: Secondary | ICD-10-CM | POA: Diagnosis not present

## 2022-09-18 DIAGNOSIS — I370 Nonrheumatic pulmonary valve stenosis: Secondary | ICD-10-CM

## 2022-09-18 DIAGNOSIS — Q256 Stenosis of pulmonary artery: Secondary | ICD-10-CM | POA: Diagnosis not present

## 2022-09-18 DIAGNOSIS — K219 Gastro-esophageal reflux disease without esophagitis: Secondary | ICD-10-CM | POA: Insufficient documentation

## 2022-09-18 LAB — ECHOCARDIOGRAM COMPLETE
Area-P 1/2: 4.8 cm2
Calc EF: 50.7 %
Est EF: 50
S' Lateral: 3.6 cm
Single Plane A2C EF: 50.1 %
Single Plane A4C EF: 51.1 %

## 2022-09-18 NOTE — Progress Notes (Signed)
Echocardiogram 2D Echocardiogram has been performed.  Fidel Levy 09/18/2022, 8:55 AM

## 2022-10-05 ENCOUNTER — Other Ambulatory Visit (HOSPITAL_COMMUNITY): Payer: Self-pay

## 2022-10-06 ENCOUNTER — Other Ambulatory Visit: Payer: Self-pay

## 2022-10-13 ENCOUNTER — Other Ambulatory Visit (HOSPITAL_COMMUNITY): Payer: Self-pay

## 2022-10-13 DIAGNOSIS — Z1231 Encounter for screening mammogram for malignant neoplasm of breast: Secondary | ICD-10-CM | POA: Diagnosis not present

## 2022-10-13 DIAGNOSIS — Z6834 Body mass index (BMI) 34.0-34.9, adult: Secondary | ICD-10-CM | POA: Diagnosis not present

## 2022-10-13 DIAGNOSIS — Z124 Encounter for screening for malignant neoplasm of cervix: Secondary | ICD-10-CM | POA: Diagnosis not present

## 2022-10-13 DIAGNOSIS — Z01419 Encounter for gynecological examination (general) (routine) without abnormal findings: Secondary | ICD-10-CM | POA: Diagnosis not present

## 2022-10-13 MED ORDER — PROGESTERONE 200 MG PO CAPS
200.0000 mg | ORAL_CAPSULE | Freq: Every evening | ORAL | 3 refills | Status: DC
  Filled 2022-10-13: qty 90, 90d supply, fill #0
  Filled 2023-02-16: qty 30, 30d supply, fill #1
  Filled 2023-03-24: qty 10, 10d supply, fill #2
  Filled 2023-03-24: qty 20, 20d supply, fill #2
  Filled 2023-04-26: qty 30, 30d supply, fill #3
  Filled 2023-05-26: qty 30, 30d supply, fill #4
  Filled 2023-06-29: qty 30, 30d supply, fill #5
  Filled 2023-08-27: qty 30, 30d supply, fill #6
  Filled 2023-09-23: qty 30, 30d supply, fill #7

## 2022-11-03 IMAGING — DX DG CHEST 1V PORT
1 series · 1 of 1 positions shown · non-contrast
Comparison: No recent examination

CLINICAL DATA: Shortness of breath

EXAM:
PORTABLE CHEST 1 VIEW

[chest ap]
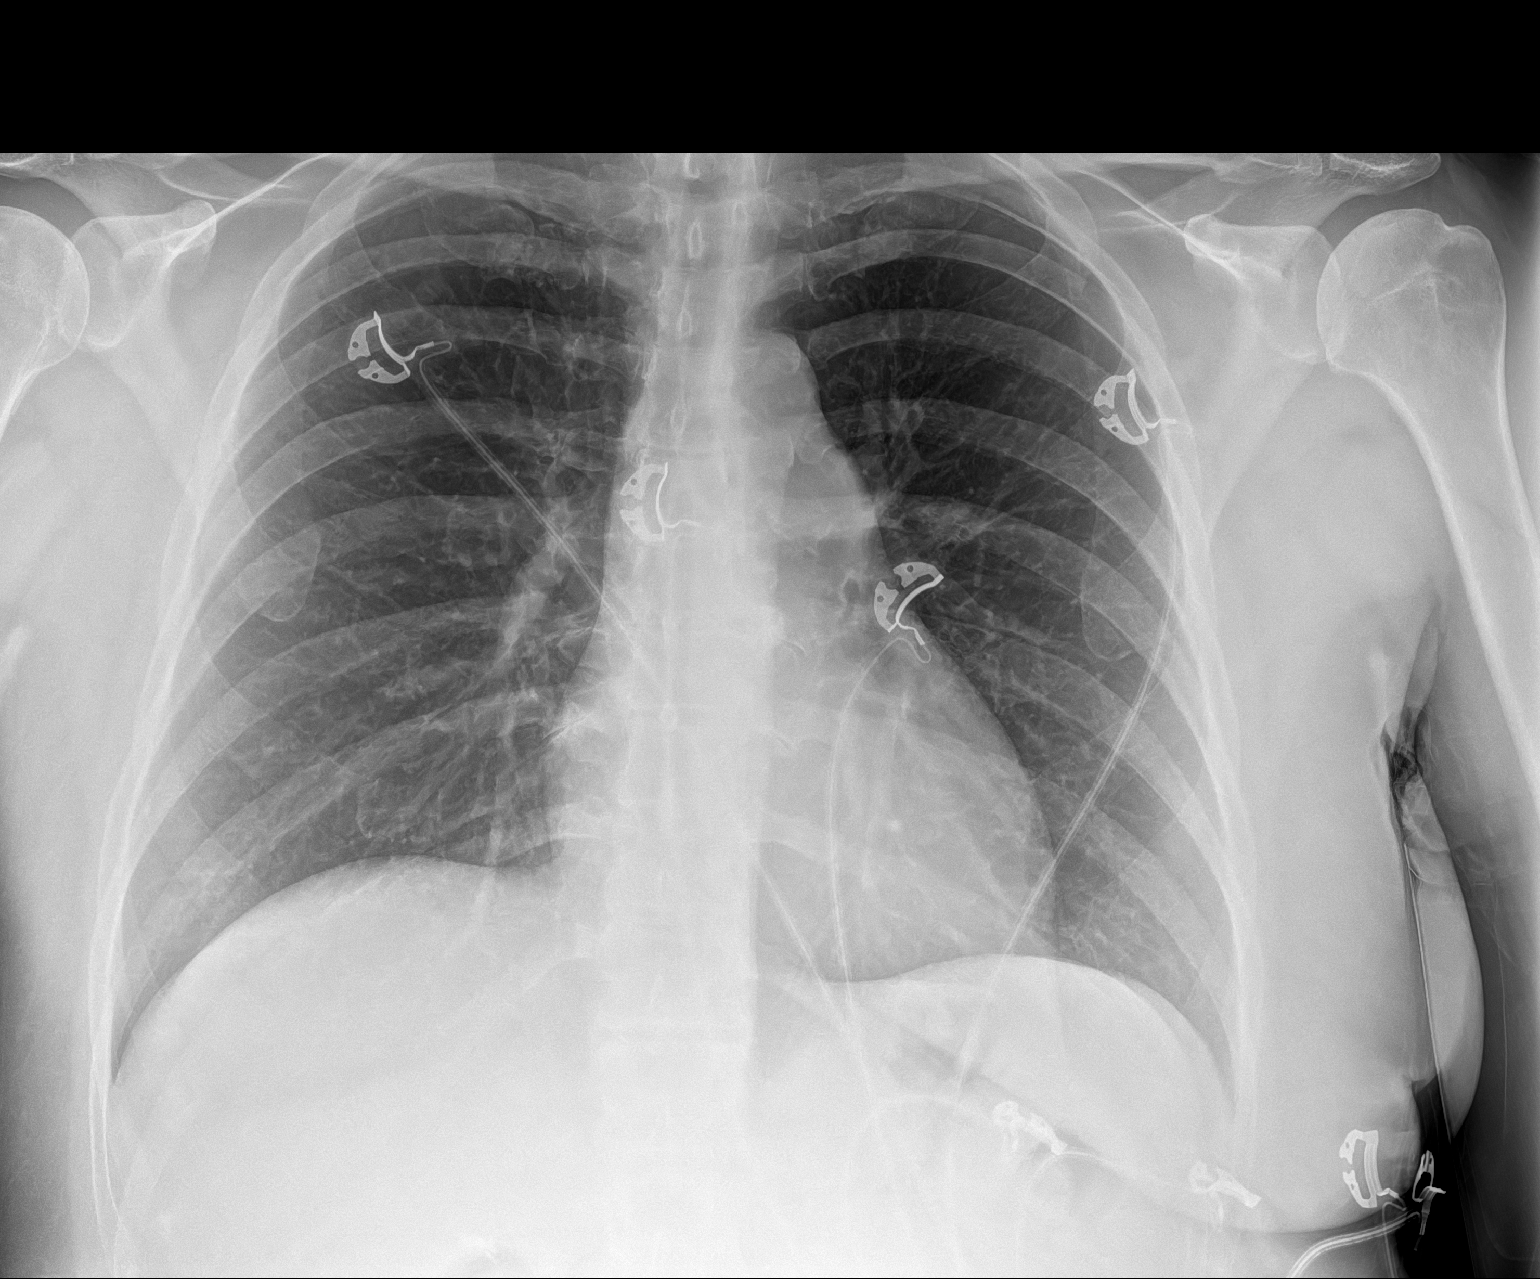

[1 of 1 positions shown; findings below may reference images not displayed]

FINDINGS: The heart size and mediastinal contours are within normal limits.
Both lungs are clear. The visualized skeletal structures are
unremarkable.
IMPRESSION: No active disease.

## 2022-11-11 DIAGNOSIS — Q828 Other specified congenital malformations of skin: Secondary | ICD-10-CM | POA: Diagnosis not present

## 2022-11-11 DIAGNOSIS — Z79899 Other long term (current) drug therapy: Secondary | ICD-10-CM | POA: Diagnosis not present

## 2022-11-17 ENCOUNTER — Other Ambulatory Visit (HOSPITAL_COMMUNITY): Payer: Self-pay

## 2022-11-17 MED ORDER — ACITRETIN 10 MG PO CAPS
10.0000 mg | ORAL_CAPSULE | Freq: Every day | ORAL | 3 refills | Status: AC
Start: 2022-11-11 — End: ?
  Filled 2023-01-11: qty 30, 30d supply, fill #0

## 2022-11-19 ENCOUNTER — Other Ambulatory Visit (HOSPITAL_COMMUNITY): Payer: Self-pay

## 2022-11-21 ENCOUNTER — Other Ambulatory Visit (HOSPITAL_COMMUNITY): Payer: Self-pay

## 2022-11-25 ENCOUNTER — Other Ambulatory Visit (HOSPITAL_COMMUNITY): Payer: Self-pay

## 2022-11-25 ENCOUNTER — Other Ambulatory Visit: Payer: Self-pay

## 2022-11-30 ENCOUNTER — Other Ambulatory Visit (HOSPITAL_COMMUNITY): Payer: Self-pay

## 2022-12-04 ENCOUNTER — Other Ambulatory Visit (HOSPITAL_COMMUNITY): Payer: Self-pay

## 2022-12-04 ENCOUNTER — Other Ambulatory Visit: Payer: Self-pay | Admitting: Gastroenterology

## 2022-12-07 NOTE — Telephone Encounter (Signed)
Sending in 72-month supply of refills.  Patient needs a follow-up office visit with Lewie Loron, NP.  Mandy, please arrange.

## 2022-12-22 ENCOUNTER — Other Ambulatory Visit: Payer: Self-pay | Admitting: Cardiology

## 2022-12-31 ENCOUNTER — Ambulatory Visit: Payer: BC Managed Care – PPO | Admitting: Gastroenterology

## 2023-01-04 ENCOUNTER — Encounter: Payer: Self-pay | Admitting: Gastroenterology

## 2023-01-07 ENCOUNTER — Other Ambulatory Visit (HOSPITAL_COMMUNITY): Payer: Self-pay

## 2023-01-07 DIAGNOSIS — G90A Postural orthostatic tachycardia syndrome (POTS): Secondary | ICD-10-CM | POA: Diagnosis not present

## 2023-01-07 DIAGNOSIS — G43009 Migraine without aura, not intractable, without status migrainosus: Secondary | ICD-10-CM | POA: Diagnosis not present

## 2023-01-07 MED ORDER — NURTEC 75 MG PO TBDP
ORAL_TABLET | ORAL | 0 refills | Status: DC
  Filled 2023-01-07: qty 8, 30d supply, fill #0

## 2023-01-08 ENCOUNTER — Other Ambulatory Visit (HOSPITAL_COMMUNITY): Payer: Self-pay

## 2023-01-11 ENCOUNTER — Other Ambulatory Visit (HOSPITAL_COMMUNITY): Payer: Self-pay

## 2023-01-27 ENCOUNTER — Other Ambulatory Visit (HOSPITAL_COMMUNITY): Payer: Self-pay

## 2023-02-04 DIAGNOSIS — G43009 Migraine without aura, not intractable, without status migrainosus: Secondary | ICD-10-CM | POA: Diagnosis not present

## 2023-02-16 ENCOUNTER — Other Ambulatory Visit (HOSPITAL_COMMUNITY): Payer: Self-pay

## 2023-03-12 ENCOUNTER — Other Ambulatory Visit: Payer: Self-pay

## 2023-03-12 ENCOUNTER — Other Ambulatory Visit (HOSPITAL_COMMUNITY): Payer: Self-pay

## 2023-03-12 MED ORDER — ESTRADIOL 0.025 MG/24HR TD PTWK
MEDICATED_PATCH | TRANSDERMAL | 3 refills | Status: AC
  Filled 2023-03-12: qty 4, 28d supply, fill #0
  Filled 2023-04-04: qty 4, 28d supply, fill #1
  Filled 2023-05-02: qty 4, 28d supply, fill #2
  Filled 2023-06-04: qty 4, 28d supply, fill #3
  Filled 2023-06-29: qty 4, 28d supply, fill #4
  Filled 2023-07-25: qty 4, 28d supply, fill #5
  Filled 2023-08-27: qty 4, 28d supply, fill #6
  Filled 2023-09-23: qty 4, 28d supply, fill #7
  Filled 2023-10-23: qty 4, 28d supply, fill #8
  Filled 2023-11-26: qty 4, 28d supply, fill #9
  Filled 2023-12-24: qty 4, 28d supply, fill #10

## 2023-03-16 ENCOUNTER — Ambulatory Visit: Payer: BC Managed Care – PPO | Admitting: Cardiology

## 2023-03-16 DIAGNOSIS — L531 Erythema annulare centrifugum: Secondary | ICD-10-CM | POA: Diagnosis not present

## 2023-03-16 DIAGNOSIS — Z79899 Other long term (current) drug therapy: Secondary | ICD-10-CM | POA: Diagnosis not present

## 2023-03-24 ENCOUNTER — Other Ambulatory Visit (HOSPITAL_COMMUNITY): Payer: Self-pay

## 2023-04-05 ENCOUNTER — Other Ambulatory Visit (HOSPITAL_COMMUNITY): Payer: Self-pay

## 2023-04-09 ENCOUNTER — Other Ambulatory Visit: Payer: Self-pay | Admitting: Gastroenterology

## 2023-04-09 DIAGNOSIS — R109 Unspecified abdominal pain: Secondary | ICD-10-CM

## 2023-04-21 ENCOUNTER — Ambulatory Visit: Payer: BC Managed Care – PPO | Admitting: Neurology

## 2023-04-27 ENCOUNTER — Other Ambulatory Visit: Payer: Self-pay | Admitting: Gastroenterology

## 2023-05-03 ENCOUNTER — Other Ambulatory Visit (HOSPITAL_COMMUNITY): Payer: Self-pay

## 2023-05-04 ENCOUNTER — Other Ambulatory Visit: Payer: Self-pay

## 2023-05-17 ENCOUNTER — Encounter: Payer: Self-pay | Admitting: Neurology

## 2023-05-17 ENCOUNTER — Ambulatory Visit (INDEPENDENT_AMBULATORY_CARE_PROVIDER_SITE_OTHER): Payer: BC Managed Care – PPO | Admitting: Neurology

## 2023-05-17 VITALS — BP 135/85 | HR 73 | Ht 65.0 in | Wt 212.0 lb

## 2023-05-17 DIAGNOSIS — G8929 Other chronic pain: Secondary | ICD-10-CM | POA: Diagnosis not present

## 2023-05-17 DIAGNOSIS — R519 Headache, unspecified: Secondary | ICD-10-CM | POA: Diagnosis not present

## 2023-05-17 DIAGNOSIS — R442 Other hallucinations: Secondary | ICD-10-CM

## 2023-05-17 DIAGNOSIS — H9193 Unspecified hearing loss, bilateral: Secondary | ICD-10-CM

## 2023-05-17 DIAGNOSIS — M542 Cervicalgia: Secondary | ICD-10-CM | POA: Diagnosis not present

## 2023-05-17 DIAGNOSIS — R202 Paresthesia of skin: Secondary | ICD-10-CM | POA: Diagnosis not present

## 2023-05-17 DIAGNOSIS — G932 Benign intracranial hypertension: Secondary | ICD-10-CM

## 2023-05-17 DIAGNOSIS — H539 Unspecified visual disturbance: Secondary | ICD-10-CM

## 2023-05-17 DIAGNOSIS — G08 Intracranial and intraspinal phlebitis and thrombophlebitis: Secondary | ICD-10-CM

## 2023-05-17 DIAGNOSIS — H9313 Tinnitus, bilateral: Secondary | ICD-10-CM

## 2023-05-17 NOTE — Progress Notes (Signed)
[facility]    Provider:  Dr Lucia Gaskins Requesting Provider: Benita Stabile, MD Primary Care Provider:  Benita Stabile, MD  CC:  headache (unlike her migraines), holocephalic head pain and tingling, neck and shoulder pain, blurriness (transient visual obscurations but not severe),   HPI:  Lisa Garner is a 52 y.o. female here as requested by Benita Stabile, MD for migraines. has Abdominal pain; Nausea; IBS (irritable bowel syndrome); Gastritis; Obesity, Class II, BMI 35-39.9, isolated; Heme + stool; GERD (gastroesophageal reflux disease); AP (abdominal pain); Occult blood in stools; Esophageal reflux; Dyspepsia; Abnormal Pap smear of cervix; Encounter for orthopedic follow-up care; Disorder of gallbladder; Dysphonia; Heart murmur; Pain in elbow; Pulmonary artery stenosis; Ulnar neuropathy; and Irritable bowel syndrome on their problem list.   She has a constant pressure and tingling her head tingles in the back of the head but sometimes on the face. This is different her normal headaches/migraines. Tingling worse over use. She did have shingles on her face but this is not due to that bc it is getting worse progressively. She has it in the back occipital , in the parient, in the frontal and on the cheeks getting worse, not really as much on the forehead but when it gets bad it can be the whole face. She has neck pain constantly which is worse. She hasn;t had her usual migraines in a long time those started behind the left eye , she had phonophobia regular sounds would seem louder like a pen click and would hurt. Phon worse photo, would progress to little bit of nausea tried imitrex and maxalt and would wear her out.   Symptoms: smelling smoke when there is none, no seizure events with it but she only smells smoke or has to be really strong to smell not related to covid, tingling, ringing in the ears if she is laying down, feels like her head is full, she was dxed with PTs on midodrine,  her etrogen was low and she is on a patch now. Her vision doesn't seem right episodes of not being able to focus. Neck pain. Constant headache pressue wakes with it goes to bed with it, a few years she has not felt well. She has been to the eye doctor her regular doctor. She is on acutane-like med called soriatane a few years ago. 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 Sometimes she feels like she has to take a deep breath but she can't her lungs dont fill up she has to try again. No other focal neurologic deficits, associated symptoms, inciting events or modifiable factors.   Reviewed notes, labs and imaging from outside physicians, which showed: I do not see any imaging of the brain in epic or Care Everywhere, I did review cardiology reports Dr. Wyline Mood from October 07, 2021 regarding an ED visit the day before for chest pain but troponins were negative EKG was sinus rhythm no acute changes and chest x-ray her showed no acute process, she had mid chest pressure pain into the right shoulder and lasted 4 to 5 hours with no coronary artery risk factors.  History of mild stenosis by prior echo or pulmonary valve.  She has a long history of orthostatic dizziness, long history of symptoms, symptoms August 2020, severe dizziness nausea vomiting, low blood pressure at that time 90s over 60s, symptoms  with standing, 5 to 10 minutes, feels hot starts to feel dizzy and hard increases sit down and feels better she was started on midodrine for that and had Florinef in the past no side effects but ended up stopping.  In her ED visit her heart rate increased from 70-1 09 with standing but she had no significant blood pressure changes, amitriptyline can cause orthostatic hypotension but time he does not seem to line up.  His exam was normal.  Diagnosed with orthostatic dizziness for her dizzy  signs, symptoms consistent with POTs and she was encouraged to use compression stockings, aggressive oral hydration and sodium intake and midodrine.  From a review of records and patient reported history medications tried > 3 months that can be used in migraine/headache management include: Amitriptyline, mag gluconate 500 mg daily, meloxicam, Zofran, ibuprofen and various over-the-counter analgesics.  Nurtec.  Tizanidine.        Latest Ref Rng & Units 10/06/2021   12:32 PM 04/09/2020    9:32 AM 01/02/2015    8:05 AM  CBC  WBC 4.0 - 10.5 K/uL 8.1  10.1  6.4   Hemoglobin 12.0 - 15.0 g/dL 16.1  09.6  04.5   Hematocrit 36.0 - 46.0 % 41.3  40.7  38.7   Platelets 150 - 400 K/uL 305  316  257       Latest Ref Rng & Units 10/06/2021   12:32 PM 04/09/2020    9:32 AM 01/02/2015    8:05 AM  CMP  Glucose 70 - 99 mg/dL 94  90  409   BUN 6 - 20 mg/dL 10  12  13    Creatinine 0.44 - 1.00 mg/dL 8.11  9.14  7.82   Sodium 135 - 145 mmol/L 138  140  140   Potassium 3.5 - 5.1 mmol/L 4.0  4.3  4.1   Chloride 98 - 111 mmol/L 102  104  105   CO2 22 - 32 mmol/L 29  25  32   Calcium 8.9 - 10.3 mg/dL 9.0  9.0  8.7   Total Protein 6.5 - 8.1 g/dL 6.7  6.5  6.4   Total Bilirubin 0.3 - 1.2 mg/dL 0.2  0.3  0.4   Alkaline Phos 38 - 126 U/L 60   47   AST 15 - 41 U/L 21  19  18    ALT 0 - 44 U/L 16  13  12      I do not see a recent TSH in epic.  No labs, imaging or reports or physician notes in Care Everywhere.   Review of Systems: Patient complains of symptoms per HPI as well as the following symptoms none. Pertinent negatives and positives per HPI. All others negative.   Social History   Socioeconomic History   Marital status: Married    Spouse name: Not on file   Number of children: 2   Years of education: Not on file   Highest education level: Not on file  Occupational History   Occupation: Camera operator [employer]  Tobacco Use   Smoking status: Never   Smokeless tobacco: Never  Vaping Use    Vaping status: Never Used  Substance and Sexual Activity   Alcohol use: Yes    Alcohol/week: 0.0 standard drinks of alcohol    Comment: occ   Drug use: No   Sexual activity: Yes    Partners: Male    Comment: No birth control  Other Topics Concern   Not on file  Social  History Narrative   Lives at home with husband.  He has two children.    Social Determinants of Health   Financial Resource Strain: Not on file  Food Insecurity: Not on file  Transportation Needs: Not on file  Physical Activity: Not on file  Stress: Not on file  Social Connections: Not on file  Intimate Partner Violence: Not on file    Family History  Problem Relation Age of Onset   Colon polyps Mother    Hyperlipidemia Mother    Migraines Mother    Celiac disease Father        Casimiro Needle [place]   Leukemia Father    Hyperlipidemia Sister    Colon cancer Neg Hx    Headache Neg Hx     Past Medical History:  Diagnosis Date   Claudication [facility])    Lower arterial duplex scan 01/05/08 - Normal   Congenital pulmonary stenosis    mild   Fatty liver    GERD (gastroesophageal reflux disease)    Helicobacter pylori gastritis 2005   IBS (irritable bowel syndrome)    Pulmonary hypertension (HCC)    TTE 10/23/11 - LV size normal. LV EF 50-55%   S/P endoscopy 05/25/08   Dr [place] Pink small bowel biopsy, reactive gastropathy   Skin disorder     Patient Active Problem List   Diagnosis Date Noted   Disorder of gallbladder 10/06/2021   Pulmonary artery stenosis 10/06/2021   Irritable bowel syndrome 10/06/2021   Abnormal Pap smear of cervix 03/20/2021   Heart murmur 03/20/2021   Dyspepsia 12/26/2020   Encounter for orthopedic follow-up care 07/19/2019   Pain in elbow 06/15/2019   Ulnar neuropathy 06/15/2019   Dysphonia 03/12/2017   AP (abdominal pain)    Occult blood in stools    Esophageal reflux    Heme + stool 12/28/2014   GERD (gastroesophageal reflux disease) 12/28/2014   Gastritis 08/20/2011    Obesity, Class II, BMI 35-39.9, isolated 08/20/2011   IBS (irritable bowel syndrome) 05/29/2011   Abdominal pain 05/21/2011   Nausea 05/21/2011    Past Surgical History:  Procedure Laterality Date   arm surgery     BIOPSY  04/19/2020   Procedure: BIOPSY;  Surgeon: Lanelle Bal, DO;  Location: [facility];  Service: Endoscopy;;  duodenal   CHOLECYSTECTOMY  1997   ERCP with sphincterotomy/stone extraction   COLONOSCOPY N/A 01/18/2015   2 colon polyps, examined TI normal. Small internal hemorrhoids. Tubular adenoma   COLONOSCOPY WITH PROPOFOL N/A 04/19/2020   Non-bleeding internal hemorrhoids, TI normal. 7 years surveillance   DILATION AND CURETTAGE OF UTERUS     ENDOMETRIAL ABLATION     ESOPHAGOGASTRODUODENOSCOPY  05/25/2008   VOZ:[id number] small bowel    ESOPHAGOGASTRODUODENOSCOPY N/A 01/18/2015   multiple gastric polyps in gastric fundus and cardiac, mild non-erosive gastritis. No Hpylori    ESOPHAGOGASTRODUODENOSCOPY (EGD) WITH PROPOFOL N/A 04/19/2020   Gastritis, multiple gastric polyps, normal duodenum. Negative villous changes. Negative H.pylori.    KNEE SURGERY Left 02/1989   TONSILLECTOMY     WISDOM TOOTH EXTRACTION  11/1989    Current Outpatient Medications  Medication Sig Dispense Refill   acitretin (SORIATANE) 10 MG capsule Take 10 mg by mouth daily before breakfast.     acitretin (SORIATANE) 10 MG capsule Take 1 capsule (10 mg total) by mouth daily. 30 capsule 3   acitretin (SORIATANE) 10 MG capsule Take 1 capsule (10 mg total) by mouth daily. 30 capsule 3   amitriptyline (ELAVIL) 10 MG tablet  TAKE (2) TABLETS BY MOUTH AT BEDTIME. 180 tablet 3   Biotin w/ Vitamins C & E (HAIR/SKIN/NAILS PO) Take 3 tablets by mouth daily.     Calcium Citrate-Vitamin D (CALCIUM + D PO) Take 2 tablets by mouth daily.     Cholecalciferol (DIALYVITE VITAMIN D 5000) 125 MCG (5000 UT) capsule Take 5,000 Units by mouth daily.     estradiol (CLIMARA - DOSED IN MG/24 HR) 0.025 mg/24hr patch Apply 1  patch every week as directed 12 patch 3   ibuprofen (ADVIL) 800 MG tablet Take 800 mg by mouth at bedtime as needed for moderate pain.      levocetirizine (XYZAL) 5 MG tablet Take 5 mg by mouth daily.     MAGNESIUM GLYCINATE PO Take 400 mg by mouth daily.     meloxicam (MOBIC) 15 MG tablet Take 15 mg by mouth daily as needed for pain.     midodrine (PROAMATINE) 5 MG tablet TAKE (1) TABLET BY MOUTH THREE TIMES A DAY. 90 tablet 5   ondansetron (ZOFRAN ODT) 4 MG disintegrating tablet Take 1 tablet (4 mg total) by mouth every 8 (eight) hours as needed for nausea or vomiting. 30 tablet 3   pantoprazole (PROTONIX) 40 MG tablet TAKE ONE TABLET BY MOUTH ONCE DAILY. 90 tablet 0   progesterone (PROMETRIUM) 200 MG capsule Take 200 mg by mouth at bedtime.     progesterone (PROMETRIUM) 200 MG capsule Take 1 capsule (200 mg total) by mouth at bedtime. 90 capsule 3   tretinoin (RETIN-A) 0.05 % cream Apply BB size amount to areas on face at bedtime 45 g 1   No current facility-administered medications for this visit.    Allergies as of 05/17/2023 - Review Complete 05/17/2023  Allergen Reaction Noted   Gluten meal  03/25/2020   Levbid [hyoscyamine sulfate] Rash 05/21/2011    Vitals: BP 135/85   Pulse 73   Ht 5\' 5"  (1.651 m)   Wt 212 lb (96.2 kg)   BMI 35.28 kg/m  Last Weight:  Wt Readings from Last 1 Encounters:  05/17/23 212 lb (96.2 kg)   Last Height:   Ht Readings from Last 1 Encounters:  05/17/23 5\' 5"  (1.651 m)     Physical exam: Exam: Gen: NAD, conversant, well nourised, obese, well groomed                     CV: RRR, no MRG. No Carotid Bruits. No peripheral edema, warm, nontender Eyes: Conjunctivae clear without exudates or hemorrhage  Neuro: Detailed Neurologic Exam  Speech:    Speech is normal; fluent and spontaneous with normal comprehension.  Cognition:    The patient is oriented to person, place, and time;     recent and remote memory intact;     language fluent;      normal attention, concentration,     fund of knowledge Cranial Nerves:    The pupils are equal, round, and reactive to light. The fundi are normal and spontaneous venous pulsations are present. Visual fields are full to finger confrontation. Extraocular movements are intact. Trigeminal sensation is intact and the muscles of mastication are normal. The face is symmetric. The palate elevates in the midline. Hearing intact. Voice is normal. Shoulder shrug is normal. The tongue has normal motion without fasciculations.   Coordination: nml  Gait: nml  Motor Observation:    No asymmetry, no atrophy, and no involuntary movements noted. Tone:    Normal muscle tone.  Posture:    Posture is normal. normal erect    Strength:    Strength is V/V in the upper and lower limbs.      Sensation: intact to LT     Reflex Exam:  DTR's:    Deep tendon reflexes in the upper and lower extremities are normal bilaterally.   Toes:    The toes are downgoing bilaterally.   Clonus:    Clonus is absent.    Assessment/Plan:  patient with suspected IDIOPATHIC INTRACRANIAL HYPERTENSION due to headache (unlike her migraines), holocephalic head pain and tingling, neck and shoulder pain, blurriness (transient visual obscurations but not severe), intractable headache, obesity, also she is on multiple medications such as midodrine for POTS and oral vitamin A due to a rare hereditary skin disorder that can likely cause or worsen intracranial hypertension.  However these are medications that are needed and patient would be hesitant to discontinue them.  We will evaluate for this disorder as below.  MRI of the brain w/wo contrast looking for signs of this such as "partially emty sella" in the brain fluid around optic nerves  CT Veins for evaluation of thrombosis or venous stenosis  Lumbar puncture for opening pressure and response to fluid removal Usually it is acetazolamide but topamax is also a treatment and is  gentler and isn;t as much of a diuretic because she has POTs so would try topiramate first Dr. Charise Killian in [place], after workup can refer back to him for OCT and VF testing or refer to ophthalmologist If the neck pain does not improve with workup, evaluation an dtreatment I would consider MRI cervical spine due to chronic neck pain and very/abnormally brisk reflexes Orders Placed This Encounter  Procedures   MR BRAIN W WO CONTRAST   CT VENOGRAM HEAD   DG FL GUIDED LUMBAR PUNCTURE   CBC with Differential/Platelets   Comprehensive metabolic panel   TSH Rfx on Abnormal to Free T4   Cc: Benita Stabile, MD,  Benita Stabile, MD  Naomie Dean, MD  [facility] [address] [place], [place] [address]  Phone [phone] Fax [phone]

## 2023-05-17 NOTE — Patient Instructions (Addendum)
MRI of the brain w/wo contrast looking for signs of this such as "partially emty sella" in the brain fluid around optic nerves  CT Veins Lumbar puncture for opening pressure  Usually it is acetazolamide but topamax is also a treatment and is gentler and isn;t as much of a diuretic because she has POTs  Dr. Charise Killian in [place]  If the neck pain does not improve I would consider MRI cervical spine Idiopathic Intracranial Hypertension  Idiopathic intracranial hypertension (IIH) is a condition that increases pressure around the brain. The fluid that surrounds the brain and spinal cord (cerebrospinal fluid, or CSF) increases and causes the pressure. Idiopathic means that the cause of this condition is not known. IIH affects the brain and spinal cord. If this condition is not treated, it can cause vision loss or blindness. What are the causes? The cause of this condition is not known. What increases the risk? The following factors may make you more likely to develop this condition: Being obese. Being a person who is female, between the ages of 62 and 21 years old, and who has not gone through menopause. Taking certain medicines, such as birth control, acne medicines, or steroids, oral vit-a like acutane (will looke up your medication as well) What are the signs or symptoms? Symptoms of this condition include: Headaches. This is the most common symptom. Brief periods of total blindness. Double vision, blurred vision, or poor side (peripheral) vision. Pain in the shoulders or neck. Nausea and vomiting. A sound like rushing water or a pulsing sound within the ears (pulsatile tinnitus), or ringing in the ears. How is this diagnosed? This condition may be diagnosed based on: Your symptoms and medical history. Imaging tests of the brain, such as: CT scan. MRI. Magnetic resonance venogram (MRV) to check the veins. Diagnostic lumbar puncture. This is a procedure to remove and examine a sample  of CSF. This procedure can determine whether your fluid pressure is too high. An eye exam to check for swelling or nerve damage in the eyes. How is this treated? Treatment for this condition depends on the symptoms. The goal of treatment is to decrease the pressure around your brain. Common treatments include: Weight loss through healthy eating, salt restriction, and exercise, if you are overweight. Medicines to decrease the production of CSF and lower the pressure within your skull. Medicines to prevent or treat headaches. Other treatments may include: Surgery to place drains (shunts) in your brain to remove extra fluid. Lumbar puncture to remove extra CSF. Follow these instructions at home: If you are overweight or obese, work with your health care provider to lose weight. Take over-the-counter and prescription medicines only as told by your health care provider. Ask your health care provider if the medicine prescribed to you requires you to avoid driving or using machinery. Do not use any products that contain nicotine or tobacco. These products include cigarettes, chewing tobacco, and vaping devices, such as e-cigarettes. If you need help quitting, ask your health care provider. Keep all follow-up visits. Your health care provider will need to monitor you regularly. Contact a health care provider if: You have changes in your vision, such as: Double vision. Blurred vision. Poor peripheral vision. Get help right away if: You have any of the following symptoms and they get worse or do not get better: Headaches. Nausea. Vomiting. Sudden trouble seeing. This information is not intended to replace advice given to you by your health care provider. Make sure you discuss any questions  you have with your health care provider. Document Revised: 01/13/2022 Document Reviewed: 12/23/2021 Elsevier Patient Education  2024 [employer].

## 2023-05-18 ENCOUNTER — Encounter: Payer: Self-pay | Admitting: Neurology

## 2023-05-18 LAB — COMPREHENSIVE METABOLIC PANEL
ALT: 19 IU/L (ref 0–32)
AST: 29 IU/L (ref 0–40)
Albumin: 4.8 g/dL (ref 3.9–4.9)
Alkaline Phosphatase: 80 IU/L (ref 44–121)
BUN/Creatinine Ratio: 18 (ref 9–23)
BUN: 17 mg/dL (ref 6–24)
Bilirubin Total: <0.2 mg/dL (ref 0.0–1.2)
CO2: 23 mmol/L (ref 20–29)
Calcium: 9.9 mg/dL (ref 8.7–10.2)
Chloride: 98 mmol/L (ref 96–106)
Creatinine, Ser: 0.92 mg/dL (ref 0.57–1.00)
Globulin, Total: 2.6 g/dL (ref 1.5–4.5)
Glucose: 83 mg/dL (ref 70–99)
Potassium: 4.4 mmol/L (ref 3.5–5.2)
Sodium: 140 mmol/L (ref 134–144)
Total Protein: 7.4 g/dL (ref 6.0–8.5)
eGFR: 76 mL/min/{1.73_m2} (ref >59)

## 2023-05-18 LAB — CBC WITH DIFFERENTIAL/PLATELET
Basophils Absolute: 0.1 10*3/uL (ref 0.0–0.2)
Basos: 1 %
EOS (ABSOLUTE): 0.3 10*3/uL (ref 0.0–0.4)
Eos: 2 %
Hematocrit: 41.7 % (ref 34.0–46.6)
Hemoglobin: 13.9 g/dL (ref 11.1–15.9)
Immature Grans (Abs): 0 10*3/uL (ref 0.0–0.1)
Immature Granulocytes: 0 %
Lymphocytes Absolute: 2.9 10*3/uL (ref 0.7–3.1)
Lymphs: 23 %
MCH: 29.1 pg (ref 26.6–33.0)
MCHC: 33.3 g/dL (ref 31.5–35.7)
MCV: 87 fL (ref 79–97)
Monocytes Absolute: 0.7 10*3/uL (ref 0.1–0.9)
Monocytes: 5 %
Neutrophils Absolute: 8.5 10*3/uL — ABNORMAL HIGH (ref 1.4–7.0)
Neutrophils: 69 %
Platelets: 353 10*3/uL (ref 150–450)
RBC: 4.77 x10E6/uL (ref 3.77–5.28)
RDW: 13.3 % (ref 11.7–15.4)
WBC: 12.4 10*3/uL — ABNORMAL HIGH (ref 3.4–10.8)

## 2023-05-18 LAB — TSH RFX ON ABNORMAL TO FREE T4: TSH: 1.95 u[IU]/mL (ref 0.450–4.500)

## 2023-05-18 NOTE — Telephone Encounter (Signed)
results sent to PCP

## 2023-05-19 ENCOUNTER — Encounter: Payer: Self-pay | Admitting: Neurology

## 2023-05-21 ENCOUNTER — Telehealth: Payer: Self-pay | Admitting: Neurology

## 2023-05-21 NOTE — Telephone Encounter (Signed)
MRI brain [employer] auth: [id number] exp. 05/21/23-06/19/23  CT venogram [employer] auth: [id number] exp. 05/21/23-06/19/23 sent to GI [phone]  Lumbar puncture was also sent here.

## 2023-06-02 DIAGNOSIS — R519 Headache, unspecified: Secondary | ICD-10-CM | POA: Diagnosis not present

## 2023-06-03 ENCOUNTER — Ambulatory Visit
Admission: RE | Admit: 2023-06-03 | Discharge: 2023-06-03 | Disposition: A | Discharge status: Home / Self Care | Payer: BC Managed Care – PPO | Source: Ambulatory Visit | Attending: Neurology | Admitting: Neurology

## 2023-06-03 DIAGNOSIS — G08 Intracranial and intraspinal phlebitis and thrombophlebitis: Secondary | ICD-10-CM

## 2023-06-03 DIAGNOSIS — R202 Paresthesia of skin: Secondary | ICD-10-CM | POA: Diagnosis not present

## 2023-06-03 DIAGNOSIS — R519 Headache, unspecified: Secondary | ICD-10-CM | POA: Diagnosis not present

## 2023-06-03 MED ORDER — IOPAMIDOL (ISOVUE-370) INJECTION 76%
200.0000 mL | Freq: Once | INTRAVENOUS | Status: AC | PRN
  Administered 2023-06-03: 80 mL via INTRAVENOUS

## 2023-06-03 MED ORDER — GADOPICLENOL 0.5 MMOL/ML IV SOLN
9.5000 mL | Freq: Once | INTRAVENOUS | Status: AC | PRN
  Administered 2023-06-03: 9.5 mL via INTRAVENOUS

## 2023-06-04 ENCOUNTER — Other Ambulatory Visit: Payer: Self-pay

## 2023-06-04 ENCOUNTER — Other Ambulatory Visit (HOSPITAL_COMMUNITY): Payer: Self-pay

## 2023-06-07 ENCOUNTER — Other Ambulatory Visit: Payer: Self-pay

## 2023-06-15 DIAGNOSIS — E782 Mixed hyperlipidemia: Secondary | ICD-10-CM | POA: Diagnosis not present

## 2023-06-16 NOTE — Telephone Encounter (Signed)
I called the reading room [facility]. They are going to get the CT-V read asap.

## 2023-06-18 ENCOUNTER — Other Ambulatory Visit (HOSPITAL_COMMUNITY): Payer: Self-pay

## 2023-06-18 ENCOUNTER — Inpatient Hospital Stay: Admission: RE | Admit: 2023-06-18 | Payer: BC Managed Care – PPO | Source: Ambulatory Visit

## 2023-06-18 DIAGNOSIS — K219 Gastro-esophageal reflux disease without esophagitis: Secondary | ICD-10-CM | POA: Diagnosis not present

## 2023-06-18 DIAGNOSIS — G43009 Migraine without aura, not intractable, without status migrainosus: Secondary | ICD-10-CM | POA: Diagnosis not present

## 2023-06-18 DIAGNOSIS — Z79899 Other long term (current) drug therapy: Secondary | ICD-10-CM | POA: Diagnosis not present

## 2023-06-18 DIAGNOSIS — N390 Urinary tract infection, site not specified: Secondary | ICD-10-CM | POA: Diagnosis not present

## 2023-06-18 MED ORDER — PANTOPRAZOLE SODIUM 40 MG PO TBEC
40.0000 mg | DELAYED_RELEASE_TABLET | Freq: Every day | ORAL | 0 refills | Status: DC
  Filled 2023-06-18: qty 30, 30d supply, fill #0

## 2023-06-24 NOTE — Discharge Instructions (Signed)

## 2023-06-25 ENCOUNTER — Ambulatory Visit
Admission: RE | Admit: 2023-06-25 | Discharge: 2023-06-25 | Disposition: A | Discharge status: Home / Self Care | Payer: BC Managed Care – PPO | Source: Ambulatory Visit | Attending: Neurology | Admitting: Neurology

## 2023-06-25 VITALS — BP 154/82 | HR 86

## 2023-06-25 DIAGNOSIS — H9313 Tinnitus, bilateral: Secondary | ICD-10-CM

## 2023-06-25 DIAGNOSIS — R202 Paresthesia of skin: Secondary | ICD-10-CM

## 2023-06-25 DIAGNOSIS — H9193 Unspecified hearing loss, bilateral: Secondary | ICD-10-CM

## 2023-06-25 DIAGNOSIS — E66812 Obesity, class 2: Secondary | ICD-10-CM | POA: Diagnosis not present

## 2023-06-25 DIAGNOSIS — G932 Benign intracranial hypertension: Secondary | ICD-10-CM

## 2023-06-25 DIAGNOSIS — G8929 Other chronic pain: Secondary | ICD-10-CM | POA: Diagnosis not present

## 2023-06-25 DIAGNOSIS — R519 Headache, unspecified: Secondary | ICD-10-CM | POA: Diagnosis not present

## 2023-06-25 DIAGNOSIS — H539 Unspecified visual disturbance: Secondary | ICD-10-CM

## 2023-06-25 LAB — CSF CELL COUNT WITH DIFFERENTIAL
RBC Count, CSF: 3 {cells}/uL — ABNORMAL HIGH
TOTAL NUCLEATED CELL: 1 {cells}/uL (ref 0–5)

## 2023-06-25 LAB — PROTEIN, CSF: Total Protein, CSF: 32 mg/dL (ref 15–45)

## 2023-06-25 LAB — GLUCOSE, CSF: Glucose, CSF: 56 mg/dL (ref 40–80)

## 2023-07-05 DIAGNOSIS — Q828 Other specified congenital malformations of skin: Secondary | ICD-10-CM | POA: Diagnosis not present

## 2023-07-05 DIAGNOSIS — Z79899 Other long term (current) drug therapy: Secondary | ICD-10-CM | POA: Diagnosis not present

## 2023-07-05 DIAGNOSIS — L531 Erythema annulare centrifugum: Secondary | ICD-10-CM | POA: Diagnosis not present

## 2023-07-06 ENCOUNTER — Other Ambulatory Visit (HOSPITAL_BASED_OUTPATIENT_CLINIC_OR_DEPARTMENT_OTHER): Payer: Self-pay

## 2023-07-06 ENCOUNTER — Other Ambulatory Visit (HOSPITAL_COMMUNITY): Payer: Self-pay

## 2023-07-06 MED ORDER — TRIAMCINOLONE ACETONIDE 0.1 % EX CREA
TOPICAL_CREAM | CUTANEOUS | 3 refills | Status: AC
  Filled 2023-07-06: qty 80, 30d supply, fill #0
  Filled 2024-03-27 – 2024-03-31 (×3): qty 80, 30d supply, fill #1
  Filled 2024-05-02: qty 80, 30d supply, fill #0

## 2023-07-14 ENCOUNTER — Other Ambulatory Visit (HOSPITAL_COMMUNITY): Payer: Self-pay

## 2023-07-14 MED ORDER — MIDODRINE HCL 5 MG PO TABS
5.0000 mg | ORAL_TABLET | Freq: Three times a day (TID) | ORAL | 6 refills | Status: DC
  Filled 2023-07-14: qty 90, 30d supply, fill #0
  Filled 2023-08-27: qty 90, 30d supply, fill #1
  Filled 2023-10-23: qty 90, 30d supply, fill #2
  Filled 2023-12-19: qty 90, 30d supply, fill #3

## 2023-07-15 ENCOUNTER — Other Ambulatory Visit (HOSPITAL_COMMUNITY): Payer: Self-pay

## 2023-07-15 MED ORDER — PANTOPRAZOLE SODIUM 40 MG PO TBEC
40.0000 mg | DELAYED_RELEASE_TABLET | Freq: Every day | ORAL | 0 refills | Status: DC
  Filled 2023-07-15: qty 30, 30d supply, fill #0

## 2023-07-22 ENCOUNTER — Other Ambulatory Visit (HOSPITAL_COMMUNITY): Payer: Self-pay

## 2023-08-03 ENCOUNTER — Encounter: Payer: Self-pay | Admitting: Neurology

## 2023-08-03 ENCOUNTER — Ambulatory Visit (INDEPENDENT_AMBULATORY_CARE_PROVIDER_SITE_OTHER): Payer: BC Managed Care – PPO | Admitting: Neurology

## 2023-08-03 VITALS — BP 112/68 | HR 66 | Ht 65.0 in | Wt 210.0 lb

## 2023-08-03 DIAGNOSIS — G43711 Chronic migraine without aura, intractable, with status migrainosus: Secondary | ICD-10-CM | POA: Insufficient documentation

## 2023-08-03 MED ORDER — AJOVY 225 MG/1.5ML ~~LOC~~ SOAJ: 225.0000 mg | SUBCUTANEOUS | Status: DC

## 2023-08-03 NOTE — Patient Instructions (Addendum)
Try Ajovy  3 months of samples Mychart me and let me know how you are doing after a few months if working and I can prescribe or we can discuss botox or emgality or vyepti and retry nurtec and ubrelvy once on a good preventative  Fremanezumab Injection What is this medication? FREMANEZUMAB (fre ma NEZ ue mab) prevents migraines. It works by blocking a substance in the body that causes migraines. It is a monoclonal antibody. This medicine may be used for other purposes; ask your health care provider or pharmacist if you have questions. COMMON BRAND NAME(S): AJOVY What should I tell my care team before I take this medication? They need to know if you have any of these conditions: An unusual or allergic reaction to fremanezumab, other medications, foods, dyes, or preservatives Pregnant or trying to get pregnant Breast-feeding How should I use this medication? This medication is injected under the skin. You will be taught how to prepare and give it. Take it as directed on the prescription label. Keep taking it unless your care team tells you to stop. It is important that you put your used needles and syringes in a special sharps container. Do not put them in a trash can. If you do not have a sharps container, call your pharmacist or care team to get one. Talk to your care team about the use of this medication in children. Special care may be needed. Overdosage: If you think you have taken too much of this medicine contact a poison control center or emergency room at once. NOTE: This medicine is only for you. Do not share this medicine with others. What if I miss a dose? If you miss a dose, take it as soon as you can. If it is almost time for your next dose, take only that dose. Do not take double or extra doses. What may interact with this medication? Interactions are not expected. This list may not describe all possible interactions. Give your health care provider a list of all the medicines,  herbs, non-prescription drugs, or dietary supplements you use. Also tell them if you smoke, drink alcohol, or use illegal drugs. Some items may interact with your medicine. What should I watch for while using this medication? Tell your care team if your symptoms do not start to get better or if they get worse. What side effects may I notice from receiving this medication? Side effects that you should report to your care team as soon as possible: Allergic reactions or angioedema--skin rash, itching or hives, swelling of the face, eyes, lips, tongue, arms, or legs, trouble swallowing or breathing Side effects that usually do not require medical attention (report to your care team if they continue or are bothersome): Pain, redness, or irritation at injection site This list may not describe all possible side effects. Call your doctor for medical advice about side effects. You may report side effects to FDA at 1-800-FDA-1088. Where should I keep my medication? Keep out of the reach of children and pets. Store in a refrigerator or at room temperature between 20 and 25 degrees C (68 and 77 degrees F). Refrigeration (preferred): Store in the refrigerator. Do not freeze. Keep in the original container until you are ready to take it. Remove the dose from the carton about 30 minutes before it is time for you to use it. If the dose is not used, it may be stored in the original container at room temperature for 7 days. Get rid of any  unused medication after the expiration date. Room Temperature: This medication may be stored at room temperature for up to 7 days. Keep it in the original container. Protect from light until time of use. If it is stored at room temperature, get rid of any unused medication after 7 days or after it expires, whichever is first. To get rid of medications that are no longer needed or have expired: Take the medication to a medication take-back program. Check with your pharmacy or law  enforcement to find a location. If you cannot return the medication, ask your pharmacist or care team how to get rid of this medication safely. NOTE: This sheet is a summary. It may not cover all possible information. If you have questions about this medicine, talk to your doctor, pharmacist, or health care provider.  2024 Elsevier/Gold Standard (2021-10-10 00:00:00)

## 2023-08-03 NOTE — Progress Notes (Signed)
[facility]    Provider:  Dr Lucia Gaskins Requesting Provider: Benita Stabile, MD Primary Care Provider:  Benita Stabile, MD  08/03/2023:  headache (unlike her migraines), holocephalic head pain and tingling, neck and shoulder pain, blurriness (transient visual obscurations but not severe),  she has tingling in the back of the head. Tried nurtec and ubrelvy and did not help. Maxalt in the past made her very fatigued, also tried imitrex. Aimovig contraindicated bc of constipation. For her neck she has been to chiropractor and acupuncture.  Patient had a workup including MRI brain/MRV/LP all unremarkable, normal opening pressure. Headaches are most days and some days are very bad. Holocephalic.  Try Ajovy.   Reviewed notes, labs and imaging from outside physicians, which showed:  Recent Results (from the past 2160 hour(s))  CBC with Differential/Platelets     Status: Abnormal   Collection Time: 05/17/23  4:28 PM  Result Value Ref Range   WBC 12.4 (H) 3.4 - 10.8 x10E3/uL   RBC 4.77 3.77 - 5.28 x10E6/uL   Hemoglobin 13.9 11.1 - 15.9 g/dL   Hematocrit 60.4 54.0 - 46.6 %   MCV 87 79 - 97 fL   MCH 29.1 26.6 - 33.0 pg   MCHC 33.3 31.5 - 35.7 g/dL   RDW 98.1 19.1 - 47.8 %   Platelets 353 150 - 450 x10E3/uL   Neutrophils 69 Not Estab. %   Lymphs 23 Not Estab. %   Monocytes 5 Not Estab. %   Eos 2 Not Estab. %   Basos 1 Not Estab. %   Neutrophils Absolute 8.5 (H) 1.4 - 7.0 x10E3/uL   Lymphocytes Absolute 2.9 0.7 - 3.1 x10E3/uL   Monocytes Absolute 0.7 0.1 - 0.9 x10E3/uL   EOS (ABSOLUTE) 0.3 0.0 - 0.4 x10E3/uL   Basophils Absolute 0.1 0.0 - 0.2 x10E3/uL   Immature Granulocytes 0 Not Estab. %   Immature Grans (Abs) 0.0 0.0 - 0.1 x10E3/uL  Comprehensive metabolic panel     Status: None   Collection Time: 05/17/23  4:28 PM  Result Value Ref Range   Glucose 83 70 - 99 mg/dL   BUN 17 6 - 24 mg/dL   Creatinine, Ser 2.95 0.57 - 1.00 mg/dL   eGFR 76 >62 ZH/YQM/5.78   BUN/Creatinine  Ratio 18 9 - 23   Sodium 140 134 - 144 mmol/L   Potassium 4.4 3.5 - 5.2 mmol/L   Chloride 98 96 - 106 mmol/L   CO2 23 20 - 29 mmol/L   Calcium 9.9 8.7 - 10.2 mg/dL   Total Protein 7.4 6.0 - 8.5 g/dL   Albumin 4.8 3.9 - 4.9 g/dL   Globulin, Total 2.6 1.5 - 4.5 g/dL   Bilirubin Total <4.6 0.0 - 1.2 mg/dL   Alkaline Phosphatase 80 44 - 121 IU/L   AST 29 0 - 40 IU/L   ALT 19 0 - 32 IU/L  TSH Rfx on Abnormal to Free T4     Status: None   Collection Time: 05/17/23  4:33 PM  Result Value Ref Range   TSH 1.950 0.450 - 4.500 uIU/mL  Glucose, CSF     Status: None   Collection Time: 06/25/23  9:48 AM  Result Value Ref Range   Glucose, CSF 56 40 - 80 mg/dL  Protein, CSF     Status: None   Collection Time: 06/25/23  9:48 AM  Result Value Ref Range   Total Protein, CSF 32 15 - 45 mg/dL  CSF cell count with differential  Status: Abnormal   Collection Time: 06/25/23  9:48 AM  Result Value Ref Range   Color, CSF COLORLESS COLORLESS   Appearance, CSF CLEAR CLEAR   RBC Count, CSF 3 (H) 0 cells/uL   TOTAL NUCLEATED CELL 1 0 - 5 cells/uL   Monocyte/Macrophage CANCELED     Comment: Result canceled by the ancillary.   LP 06/25/2023:  Narrative & Impression  CLINICAL DATA:  Chronic intractable headache. Facial tingling. Tinnitus and hearing loss. Vision changes. Suspected idiopathic intracranial hypertension.   EXAM: DIAGNOSTIC LUMBAR PUNCTURE UNDER FLUOROSCOPIC GUIDANCE   COMPARISON:  None Available.   FLUOROSCOPY: Radiation Exposure Index (as provided by the fluoroscopic device): 1.30 mGy Kerma   PROCEDURE: Informed consent was obtained from the patient prior to the procedure, including potential complications of headache, allergy, and pain. With the patient prone, the lower back was prepped with Betadine. 1% lidocaine was used for local anesthesia. Lumbar puncture was performed at the L3-4 level via a left interlaminar approach using a 6 inch 20 gauge needle with return of  clear, colorless CSF with an opening pressure of 18 cm water (measured in the left lateral decubitus position). 10 mL of CSF were obtained for laboratory studies. Closing pressure was 13 cm water. The patient tolerated the procedure well, and there were no apparent complications.   IMPRESSION: Successful fluoroscopically-guided lumbar puncture. Opening pressure of 18 cm water.    MRI brain 06/03/2023 FINDINGS:  The brain parenchyma shows no abnormal signal intensities.  No structural lesion, tumor or infarct is noted.  Diffusion-weighted imaging is negative for acute ischemia.  T2 star images shows no significant microhemorrhages.  There are few scattered nonspecific periventricular and subcortical T2/FLAIR white matter hyperintensities which may be seen in a variety of conditions including migraine headaches, remote trauma, small vessel disease, vasculitic, autoimmune, inflammatory or demyelinating conditions.  None of these show any postcontrast enhancement or involve the corpus callosum, brainstem or cerebellum.  There is a small benign left choroid fissure cyst noted.  Subarachnoid spaces and ventricular system appear normal.  Cortical sulci and gyri show normal appearance.  Extra-axial brain structures appear normal.  Calvarium showed no abnormalities.  Orbits appear unremarkable.  Paranasal sinuses show only minor chronic mucosal thickening.  The pituitary gland and cerebellar tonsils appear normal.  The flow-voids of large vessels are intact and circulation appear to be patent.  Postcontrast images do not result in abnormal areas of enhancement.  Visualized portion of the cervical spine shows mild disc degenerative changes at C3/4.         IMPRESSION: MRI scan of the brain with and without contrast showing nonspecific periventricular and subcortical white matter hyperintensities with the differential discussed above.  No enhancing lesions are noted.  CTV 06/16/2023: EXAM: CT VENOGRAM  HEAD   TECHNIQUE: Venographic phase images of the brain were obtained following the administration of intravenous contrast. Multiplanar reformats and maximum intensity projections were generated.   RADIATION DOSE REDUCTION: This exam was performed according to the departmental dose-optimization program which includes automated exposure control, adjustment of the mA and/or kV according to patient size and/or use of iterative reconstruction technique.   CONTRAST:  80mL ISOVUE-370 IOPAMIDOL (ISOVUE-370) INJECTION 76%   COMPARISON:  Head MRI 06/03/2023   FINDINGS: Noncontrast CT head:   There is no evidence of an acute infarct, intracranial hemorrhage, mass, midline shift, or extra-axial fluid collection. The ventricles and sulci are normal.   No skull fracture or suspicious osseous lesion is identified. The included paranasal sinuses  and mastoid air cells are clear. The orbits are unremarkable.   CT venogram:   The superior sagittal sinus, internal cerebral veins, vein of Galen, straight sinus, transverse sinuses, sigmoid sinuses, and jugular bulbs are patent without evidence of thrombus or a significant focal stenosis. The right transverse and sigmoid sinuses are dominant, and the left transverse sinus is particularly hypoplastic.   IMPRESSION: Negative CT venogram.  Patient complains of symptoms per HPI as well as the following symptoms: none . Pertinent negatives and positives per HPI. All others negative    HPI:  Lisa Garner is a 52 y.o. female here as requested by Benita Stabile, MD for migraines. has Abdominal pain; Nausea; IBS (irritable bowel syndrome); Gastritis; Obesity, Class II, BMI 35-39.9, isolated; Heme + stool; GERD (gastroesophageal reflux disease); AP (abdominal pain); Occult blood in stools; Esophageal reflux; Dyspepsia; Abnormal Pap smear of cervix; Encounter for orthopedic follow-up care; Disorder of gallbladder; Dysphonia; Heart murmur; Pain in  elbow; Pulmonary artery stenosis; Ulnar neuropathy; Irritable bowel syndrome; and Chronic migraine without aura, with intractable migraine, so stated, with status migrainosus on their problem list.   She has a constant pressure and tingling her head tingles in the back of the head but sometimes on the face. This is different her normal headaches/migraines. Tingling worse over use. She did have shingles on her face but this is not due to that bc it is getting worse progressively. She has it in the back occipital , in the parient, in the frontal and on the cheeks getting worse, not really as much on the forehead but when it gets bad it can be the whole face. She has neck pain constantly which is worse. She hasn;t had her usual migraines in a long time those started behind the left eye , she had phonophobia regular sounds would seem louder like a pen click and would hurt. Phon worse photo, would progress to little bit of nausea tried imitrex and maxalt and would wear her out.   Symptoms: smelling smoke when there is none, no seizure events with it but she only smells smoke or has to be really strong to smell not related to covid, tingling, ringing in the ears if she is laying down, feels like her head is full, she was dxed with PTs on midodrine, her etrogen was low and she is on a patch now. Her vision doesn't seem right episodes of not being able to focus. Neck pain. Constant headache pressue wakes with it goes to bed with it, a few years she has not felt well. She has been to the eye doctor her regular doctor. She is on acutane-like med called soriatane a few years ago. 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 Sometimes she feels like she has to take a deep breath but she can't her lungs dont fill up she has to try again. No other focal neurologic deficits, associated symptoms,  inciting events or modifiable factors.   Reviewed notes, labs and imaging from outside physicians, which showed: I do not see any imaging of the brain in epic or Care Everywhere, I did review cardiology reports Dr. Wyline Mood from October 07, 2021 regarding an ED visit the day before for chest pain but troponins were negative EKG was sinus rhythm no acute changes and chest x-ray her showed no acute process, she had mid chest  pressure pain into the right shoulder and lasted 4 to 5 hours with no coronary artery risk factors.  History of mild stenosis by prior echo or pulmonary valve.  She has a long history of orthostatic dizziness, long history of symptoms, symptoms August 2020, severe dizziness nausea vomiting, low blood pressure at that time 90s over 60s, symptoms with standing, 5 to 10 minutes, feels hot starts to feel dizzy and hard increases sit down and feels better she was started on midodrine for that and had Florinef in the past no side effects but ended up stopping.  In her ED visit her heart rate increased from 70-1 09 with standing but she had no significant blood pressure changes, amitriptyline can cause orthostatic hypotension but time he does not seem to line up.  His exam was normal.  Diagnosed with orthostatic dizziness for her dizzy signs, symptoms consistent with POTs and she was encouraged to use compression stockings, aggressive oral hydration and sodium intake and midodrine.  From a review of records and patient reported history medications tried > 3 months that can be used in migraine/headache management include: Amitriptyline, mag gluconate 500 mg daily, meloxicam, Zofran, ibuprofen and various over-the-counter analgesics.  Nurtec.  Tizanidine.        Latest Ref Rng & Units 05/17/2023    4:28 PM 10/06/2021   12:32 PM 04/09/2020    9:32 AM  CBC  WBC 3.4 - 10.8 x10E3/uL 12.4  8.1  10.1   Hemoglobin 11.1 - 15.9 g/dL 96.0  45.4  09.8   Hematocrit 34.0 - 46.6 % 41.7  41.3  40.7    Platelets 150 - 450 x10E3/uL 353  305  316       Latest Ref Rng & Units 05/17/2023    4:28 PM 10/06/2021   12:32 PM 04/09/2020    9:32 AM  CMP  Glucose 70 - 99 mg/dL 83  94  90   BUN 6 - 24 mg/dL 17  10  12    Creatinine 0.57 - 1.00 mg/dL 1.19  1.47  8.29   Sodium 134 - 144 mmol/L 140  138  140   Potassium 3.5 - 5.2 mmol/L 4.4  4.0  4.3   Chloride 96 - 106 mmol/L 98  102  104   CO2 20 - 29 mmol/L 23  29  25    Calcium 8.7 - 10.2 mg/dL 9.9  9.0  9.0   Total Protein 6.0 - 8.5 g/dL 7.4  6.7  6.5   Total Bilirubin 0.0 - 1.2 mg/dL <5.6  0.2  0.3   Alkaline Phos 44 - 121 IU/L 80  60    AST 0 - 40 IU/L 29  21  19    ALT 0 - 32 IU/L 19  16  13      I do not see a recent TSH in epic.  No labs, imaging or reports or physician notes in Care Everywhere.   Review of Systems: Patient complains of symptoms per HPI as well as the following symptoms none. Pertinent negatives and positives per HPI. All others negative.   Social History   Socioeconomic History   Marital status: Married    Spouse name: Not on file   Number of children: 2   Years of education: Not on file   Highest education level: Not on file  Occupational History   Occupation: Camera operator [employer]  Tobacco Use   Smoking status: Never   Smokeless tobacco: Never  Vaping Use   Vaping status: Never  Used  Substance and Sexual Activity   Alcohol use: Yes    Alcohol/week: 0.0 standard drinks of alcohol    Comment: occ   Drug use: No   Sexual activity: Yes    Partners: Male    Comment: No birth control  Other Topics Concern   Not on file  Social History Narrative   Lives at home with husband.  He has two children.    Social Determinants of Health   Financial Resource Strain: Not on file  Food Insecurity: Not on file  Transportation Needs: Not on file  Physical Activity: Not on file  Stress: Not on file  Social Connections: Not on file  Intimate Partner Violence: Not on file    Family History  Problem  Relation Age of Onset   Colon polyps Mother    Hyperlipidemia Mother    Migraines Mother    Celiac disease Father        Casimiro Needle [place]   Leukemia Father    Hyperlipidemia Sister    Colon cancer Neg Hx    Headache Neg Hx     Past Medical History:  Diagnosis Date   Claudication [facility])    Lower arterial duplex scan 01/05/08 - Normal   Congenital pulmonary stenosis    mild   Fatty liver    GERD (gastroesophageal reflux disease)    Helicobacter pylori gastritis 2005   IBS (irritable bowel syndrome)    Pulmonary hypertension (HCC)    TTE 10/23/11 - LV size normal. LV EF 50-55%   S/P endoscopy 05/25/08   Dr Maryland Pink small bowel biopsy, reactive gastropathy   Skin disorder     Patient Active Problem List   Diagnosis Date Noted   Chronic migraine without aura, with intractable migraine, so stated, with status migrainosus 08/03/2023   Disorder of gallbladder 10/06/2021   Pulmonary artery stenosis 10/06/2021   Irritable bowel syndrome 10/06/2021   Abnormal Pap smear of cervix 03/20/2021   Heart murmur 03/20/2021   Dyspepsia 12/26/2020   Encounter for orthopedic follow-up care 07/19/2019   Pain in elbow 06/15/2019   Ulnar neuropathy 06/15/2019   Dysphonia 03/12/2017   AP (abdominal pain)    Occult blood in stools    Esophageal reflux    Heme + stool 12/28/2014   GERD (gastroesophageal reflux disease) 12/28/2014   Gastritis 08/20/2011   Obesity, Class II, BMI 35-39.9, isolated 08/20/2011   IBS (irritable bowel syndrome) 05/29/2011   Abdominal pain 05/21/2011   Nausea 05/21/2011    Past Surgical History:  Procedure Laterality Date   arm surgery     BIOPSY  04/19/2020   Procedure: BIOPSY;  Surgeon: Lanelle Bal, DO;  Location: [facility];  Service: Endoscopy;;  duodenal   CHOLECYSTECTOMY  1997   ERCP with sphincterotomy/stone extraction   COLONOSCOPY N/A 01/18/2015   2 colon polyps, examined TI normal. Small internal hemorrhoids. Tubular adenoma   COLONOSCOPY  WITH PROPOFOL N/A 04/19/2020   Non-bleeding internal hemorrhoids, TI normal. 7 years surveillance   DILATION AND CURETTAGE OF UTERUS     ENDOMETRIAL ABLATION     ESOPHAGOGASTRODUODENOSCOPY  05/25/2008   [id number] small bowel    ESOPHAGOGASTRODUODENOSCOPY N/A 01/18/2015   multiple gastric polyps in gastric fundus and cardiac, mild non-erosive gastritis. No Hpylori    ESOPHAGOGASTRODUODENOSCOPY (EGD) WITH PROPOFOL N/A 04/19/2020   Gastritis, multiple gastric polyps, normal duodenum. Negative villous changes. Negative H.pylori.    KNEE SURGERY Left 02/1989   TONSILLECTOMY     WISDOM TOOTH EXTRACTION  11/1989    Current Outpatient Medications  Medication Sig Dispense Refill   acitretin (SORIATANE) 10 MG capsule Take 10 mg by mouth daily before breakfast.     acitretin (SORIATANE) 10 MG capsule Take 1 capsule (10 mg total) by mouth daily. 30 capsule 3   acitretin (SORIATANE) 10 MG capsule Take 1 capsule (10 mg total) by mouth daily. 30 capsule 3   amitriptyline (ELAVIL) 10 MG tablet TAKE (2) TABLETS BY MOUTH AT BEDTIME. 180 tablet 3   Biotin w/ Vitamins C & E (HAIR/SKIN/NAILS PO) Take 3 tablets by mouth daily.     Calcium Citrate-Vitamin D (CALCIUM + D PO) Take 2 tablets by mouth daily.     Cholecalciferol (DIALYVITE VITAMIN D 5000) 125 MCG (5000 UT) capsule Take 5,000 Units by mouth daily.     estradiol (CLIMARA - DOSED IN MG/24 HR) 0.025 mg/24hr patch Apply 1 patch every week as directed 12 patch 3   Fremanezumab-vfrm (AJOVY) 225 MG/1.5ML SOAJ Inject 225 mg into the skin every 30 (thirty) days.     ibuprofen (ADVIL) 800 MG tablet Take 800 mg by mouth at bedtime as needed for moderate pain.      levocetirizine (XYZAL) 5 MG tablet Take 5 mg by mouth daily.     MAGNESIUM GLYCINATE PO Take 400 mg by mouth daily.     meloxicam (MOBIC) 15 MG tablet Take 15 mg by mouth daily as needed for pain.     midodrine (PROAMATINE) 5 MG tablet TAKE (1) TABLET BY MOUTH THREE TIMES A DAY. 90 tablet 5   midodrine  (PROAMATINE) 5 MG tablet Take 1 tablet (5 mg total) by mouth 3 (three) times daily. 90 tablet 6   ondansetron (ZOFRAN ODT) 4 MG disintegrating tablet Take 1 tablet (4 mg total) by mouth every 8 (eight) hours as needed for nausea or vomiting. 30 tablet 3   pantoprazole (PROTONIX) 40 MG tablet TAKE ONE TABLET BY MOUTH ONCE DAILY. 90 tablet 0   pantoprazole (PROTONIX) 40 MG tablet Take 1 tablet (40 mg total) by mouth daily. 30 tablet 0   progesterone (PROMETRIUM) 200 MG capsule Take 200 mg by mouth at bedtime.     progesterone (PROMETRIUM) 200 MG capsule Take 1 capsule (200 mg total) by mouth at bedtime. 90 capsule 3   tretinoin (RETIN-A) 0.05 % cream Apply BB size amount to areas on face at bedtime 45 g 1   triamcinolone cream (KENALOG) 0.1 % Apply to affected areas daily as needed 80 g 3   No current facility-administered medications for this visit.    Allergies as of 08/03/2023 - Review Complete 08/03/2023  Allergen Reaction Noted   Gluten meal  03/25/2020   Levbid [hyoscyamine sulfate] Rash 05/21/2011    Vitals: BP 112/68   Pulse 66   Ht 5\' 5"  (1.651 m)   Wt 210 lb (95.3 kg)   BMI 34.95 kg/m  Last Weight:  Wt Readings from Last 1 Encounters:  08/03/23 210 lb (95.3 kg)   Last Height:   Ht Readings from Last 1 Encounters:  08/03/23 5\' 5"  (1.651 m)    Physical exam: Exam: Gen: NAD, conversant      CV: No palpitations or chest pain or SOB. VS: Breathing at a normal rate. . Not febrile. Eyes: Conjunctivae clear without exudates or hemorrhage  Neuro: Detailed Neurologic Exam  Speech:    Speech is normal; fluent and spontaneous with normal comprehension.  Cognition:    The patient is oriented to person, place, and  time;     recent and remote memory intact;     language fluent;     normal attention, concentration, fund of knowledge Cranial Nerves:    The pupils are equal, round, and reactive to light. Visual fields are full Extraocular movements are intact.  The face is  symmetric with normal sensation. The palate elevates in the midline. Hearing intact. Voice is normal. Shoulder shrug is normal. The tongue has normal motion without fasciculations.   Coordination: normal  Gait:    Normal  Motor Observation:   no involuntary movements noted. Tone:    Appears normal, no spasticity appreciated  Posture:    Posture is normal. normal erect    Strength:    Strength is anti-gravity and symmetric in the upper and lower limbs.      Sensation: intact to LT, no reports of numbness or tingling or paresthesias         Assessment/Plan:  patient with suspected IDIOPATHIC INTRACRANIAL HYPERTENSION due to headache but WORKUP NORMAL did not show IDIOPATHIC INTRACRANIAL HYPERTENSION. Her headaches reported (unlike her migraines), holocephalic head pain and tingling, neck and shoulder pain, blurriness (transient visual obscurations but not severe), intractable headache, obesity, also she is on multiple medications such as midodrine for POTS and oral vitamin A due to a rare hereditary skin disorder that can likely cause or worsen intracranial hypertension but again her workup was normal including LP opening pressure.  However these are medications that are needed and patient would be hesitant to discontinue them.    Patient had a workup including MRI brain/MRV/LP all unremarkable, normal opening pressure. Headaches and migraines are still most days and some days are very bad. Holocephalic.  Try Ajovy. > 8 migraine days a month and > 25 total headache days a month.   Try Enid Cutter her 3 months of samples, she can mychart me with results Mychart me and let me know how you are doing after a few months if working and I can prescribe Ajovy or we can discuss botox or emgality or vyepti and retry nurtec and ubrelvy once on a good preventative   Meds ordered this encounter  Medications   Fremanezumab-vfrm (AJOVY) 225 MG/1.5ML SOAJ    Sig: Inject 225 mg into the skin every  30 (thirty) days.    [id number]    Cc: Benita Stabile, MD,  Benita Stabile, MD  Naomie Dean, MD  [facility] [address] [place], [place] [address]  Phone [phone] Fax [phone]  I spent over 45 minutes of face-to-face and non-face-to-face time with patient on the  1. Chronic migraine without aura, with intractable migraine, so stated, with status migrainosus    diagnosis.  This included previsit chart review, lab review, study review, order entry, electronic health record documentation, patient education on the different diagnostic and therapeutic options, counseling and coordination of care, risks and benefits of management, compliance, or risk factor reduction

## 2023-08-27 ENCOUNTER — Other Ambulatory Visit: Payer: Self-pay

## 2023-08-27 ENCOUNTER — Other Ambulatory Visit (HOSPITAL_COMMUNITY): Payer: Self-pay

## 2023-08-27 MED ORDER — PANTOPRAZOLE SODIUM 40 MG PO TBEC
40.0000 mg | DELAYED_RELEASE_TABLET | Freq: Every day | ORAL | 0 refills | Status: DC
  Filled 2023-08-27: qty 30, 30d supply, fill #0

## 2023-09-03 DIAGNOSIS — S46911A Strain of unspecified muscle, fascia and tendon at shoulder and upper arm level, right arm, initial encounter: Secondary | ICD-10-CM | POA: Diagnosis not present

## 2023-09-23 ENCOUNTER — Other Ambulatory Visit (HOSPITAL_COMMUNITY): Payer: Self-pay

## 2023-09-24 ENCOUNTER — Other Ambulatory Visit: Payer: Self-pay

## 2023-09-24 ENCOUNTER — Other Ambulatory Visit (HOSPITAL_COMMUNITY): Payer: Self-pay

## 2023-09-24 MED ORDER — PANTOPRAZOLE SODIUM 40 MG PO TBEC
40.0000 mg | DELAYED_RELEASE_TABLET | Freq: Every day | ORAL | 0 refills | Status: DC
  Filled 2023-09-24: qty 30, 30d supply, fill #0

## 2023-10-18 ENCOUNTER — Other Ambulatory Visit (HOSPITAL_COMMUNITY): Payer: Self-pay

## 2023-10-23 ENCOUNTER — Other Ambulatory Visit (HOSPITAL_COMMUNITY): Payer: Self-pay

## 2023-10-25 ENCOUNTER — Other Ambulatory Visit: Payer: Self-pay

## 2023-10-25 ENCOUNTER — Other Ambulatory Visit (HOSPITAL_COMMUNITY): Payer: Self-pay

## 2023-10-25 MED ORDER — PANTOPRAZOLE SODIUM 40 MG PO TBEC
40.0000 mg | DELAYED_RELEASE_TABLET | Freq: Every day | ORAL | 0 refills | Status: DC
  Filled 2023-10-25: qty 30, 30d supply, fill #0

## 2023-11-01 ENCOUNTER — Other Ambulatory Visit (HOSPITAL_COMMUNITY): Payer: Self-pay

## 2023-11-01 DIAGNOSIS — L531 Erythema annulare centrifugum: Secondary | ICD-10-CM | POA: Diagnosis not present

## 2023-11-01 DIAGNOSIS — Z79899 Other long term (current) drug therapy: Secondary | ICD-10-CM | POA: Diagnosis not present

## 2023-11-01 DIAGNOSIS — Q828 Other specified congenital malformations of skin: Secondary | ICD-10-CM | POA: Diagnosis not present

## 2023-11-01 MED ORDER — ACITRETIN 10 MG PO CAPS
10.0000 mg | ORAL_CAPSULE | Freq: Every day | ORAL | 3 refills | Status: AC
  Filled 2023-12-07: qty 30, 30d supply, fill #0

## 2023-11-01 MED ORDER — KETOCONAZOLE 2 % EX CREA
TOPICAL_CREAM | Freq: Every day | CUTANEOUS | 3 refills | Status: AC
  Filled 2023-11-01: qty 60, 15d supply, fill #0
  Filled 2024-04-10: qty 60, 15d supply, fill #1
  Filled 2024-04-10 – 2024-05-02 (×14): qty 60, 15d supply, fill #0

## 2023-11-04 ENCOUNTER — Other Ambulatory Visit (HOSPITAL_COMMUNITY): Payer: Self-pay

## 2023-11-05 ENCOUNTER — Other Ambulatory Visit (HOSPITAL_COMMUNITY): Payer: Self-pay

## 2023-11-05 ENCOUNTER — Ambulatory Visit
Admission: EM | Admit: 2023-11-05 | Discharge: 2023-11-05 | Disposition: A | Discharge status: Home / Self Care | Attending: Family Medicine | Admitting: Family Medicine

## 2023-11-05 ENCOUNTER — Encounter: Payer: Self-pay | Admitting: Emergency Medicine

## 2023-11-05 DIAGNOSIS — J3089 Other allergic rhinitis: Secondary | ICD-10-CM | POA: Diagnosis not present

## 2023-11-05 DIAGNOSIS — J209 Acute bronchitis, unspecified: Secondary | ICD-10-CM

## 2023-11-05 MED ORDER — PROMETHAZINE-DM 6.25-15 MG/5ML PO SYRP
5.0000 mL | ORAL_SOLUTION | Freq: Four times a day (QID) | ORAL | 0 refills | Status: AC | PRN
  Filled 2023-11-05: qty 100, 5d supply, fill #0

## 2023-11-05 MED ORDER — PREDNISONE 20 MG PO TABS
40.0000 mg | ORAL_TABLET | Freq: Every day | ORAL | 0 refills | Status: AC
  Filled 2023-11-05: qty 10, 5d supply, fill #0

## 2023-11-05 NOTE — ED Provider Notes (Signed)
 [facility]    CSN: [id number] Arrival date & time: 11/05/23  0803      History   Chief Complaint No chief complaint on file.   HPI Lisa Garner is a 53 y.o. female.   Patient presenting today with over a week of ongoing hacking cough, sore throat.  Denies fever, chills, chest pain, shortness of breath, abdominal pain, nausea vomiting or diarrhea.  So far trying over-the-counter cold and congestion medication with minimal relief.  No known history of chronic pulmonary disease.  Does have a history of seasonal allergies currently only treated with nasal sprays.    Past Medical History:  Diagnosis Date   Claudication [facility])    Lower arterial duplex scan 01/05/08 - Normal   Congenital pulmonary stenosis    mild   Fatty liver    GERD (gastroesophageal reflux disease)    Helicobacter pylori gastritis 2005   IBS (irritable bowel syndrome)    Pulmonary hypertension (HCC)    TTE 10/23/11 - LV size normal. LV EF 50-55%   S/P endoscopy 05/25/08   Dr Maryland Pink small bowel biopsy, reactive gastropathy   Skin disorder     Patient Active Problem List   Diagnosis Date Noted   Chronic migraine without aura, with intractable migraine, so stated, with status migrainosus 08/03/2023   Disorder of gallbladder 10/06/2021   Pulmonary artery stenosis 10/06/2021   Irritable bowel syndrome 10/06/2021   Abnormal Pap smear of cervix 03/20/2021   Heart murmur 03/20/2021   Dyspepsia 12/26/2020   Encounter for orthopedic follow-up care 07/19/2019   Pain in elbow 06/15/2019   Ulnar neuropathy 06/15/2019   Dysphonia 03/12/2017   AP (abdominal pain)    Occult blood in stools    Esophageal reflux    Heme + stool 12/28/2014   GERD (gastroesophageal reflux disease) 12/28/2014   Gastritis 08/20/2011   Obesity, Class II, BMI 35-39.9, isolated 08/20/2011   IBS (irritable bowel syndrome) 05/29/2011   Abdominal pain 05/21/2011   Nausea 05/21/2011    Past Surgical History:   Procedure Laterality Date   arm surgery     BIOPSY  04/19/2020   Procedure: BIOPSY;  Surgeon: Lanelle Bal, DO;  Location: [facility];  Service: Endoscopy;;  duodenal   CHOLECYSTECTOMY  1997   ERCP with sphincterotomy/stone extraction   COLONOSCOPY N/A 01/18/2015   2 colon polyps, examined TI normal. Small internal hemorrhoids. Tubular adenoma   COLONOSCOPY WITH PROPOFOL N/A 04/19/2020   Non-bleeding internal hemorrhoids, TI normal. 7 years surveillance   DILATION AND CURETTAGE OF UTERUS     ENDOMETRIAL ABLATION     ESOPHAGOGASTRODUODENOSCOPY  05/25/2008   WUJ:[id number] small bowel    ESOPHAGOGASTRODUODENOSCOPY N/A 01/18/2015   multiple gastric polyps in gastric fundus and cardiac, mild non-erosive gastritis. No Hpylori    ESOPHAGOGASTRODUODENOSCOPY (EGD) WITH PROPOFOL N/A 04/19/2020   Gastritis, multiple gastric polyps, normal duodenum. Negative villous changes. Negative H.pylori.    KNEE SURGERY Left 02/1989   TONSILLECTOMY     WISDOM TOOTH EXTRACTION  11/1989    OB History   No obstetric history on file.      Home Medications    Prior to Admission medications   Medication Sig Start Date End Date Taking? Authorizing Provider  predniSONE (DELTASONE) 20 MG tablet Take 2 tablets (40 mg total) by mouth daily with breakfast. 11/05/23  Yes Particia Nearing, PA-C  promethazine-dextromethorphan (PROMETHAZINE-DM) 6.25-15 MG/5ML syrup Take 5 mLs by mouth 4 (four) times daily as needed. 11/05/23  Yes Maurice March,  Salley Hews, PA-C  acitretin (SORIATANE) 10 MG capsule Take 10 mg by mouth daily before breakfast.    [provider]  acitretin (SORIATANE) 10 MG capsule Take 1 capsule (10 mg total) by mouth daily. 07/09/22   Marcelino Duster, MD  acitretin (SORIATANE) 10 MG capsule Take 1 capsule (10 mg total) by mouth daily. 11/11/22   Marcelino Duster, MD  acitretin (SORIATANE) 10 MG capsule Take 1 capsule (10 mg total) by mouth daily. 11/01/23   Marcelino Duster, MD  Biotin w/  Vitamins C & E (HAIR/SKIN/NAILS PO) Take 3 tablets by mouth daily.    [provider]  Calcium Citrate-Vitamin D (CALCIUM + D PO) Take 2 tablets by mouth daily.    [provider]  Cholecalciferol (DIALYVITE VITAMIN D 5000) 125 MCG (5000 UT) capsule Take 5,000 Units by mouth daily.    [provider]  estradiol (CLIMARA - DOSED IN MG/24 HR) 0.025 mg/24hr patch Apply 1 patch every week as directed 03/12/23     ibuprofen (ADVIL) 800 MG tablet Take 800 mg by mouth at bedtime as needed for moderate pain.     [provider]  ketoconazole (NIZORAL) 2 % cream Apply topically once to twice daily as needed for groin creases 11/01/23     levocetirizine (XYZAL) 5 MG tablet Take 5 mg by mouth daily. 11/29/18   [provider]  MAGNESIUM GLYCINATE PO Take 400 mg by mouth daily.    [provider]  meloxicam (MOBIC) 15 MG tablet Take 15 mg by mouth daily as needed for pain.    [provider]  midodrine (PROAMATINE) 5 MG tablet TAKE (1) TABLET BY MOUTH THREE TIMES A DAY. 12/22/22   Antoine Poche, MD  midodrine (PROAMATINE) 5 MG tablet Take 1 tablet (5 mg total) by mouth 3 (three) times daily. 12/22/22     ondansetron (ZOFRAN ODT) 4 MG disintegrating tablet Take 1 tablet (4 mg total) by mouth every 8 (eight) hours as needed for nausea or vomiting. 12/26/20   Gelene Mink, NP  pantoprazole (PROTONIX) 40 MG tablet TAKE ONE TABLET BY MOUTH ONCE DAILY. 12/07/22   Letta Median, PA-C  pantoprazole (PROTONIX) 40 MG tablet Take 1 tablet (40 mg total) by mouth daily. Patient needs appointment for future refills 10/25/23     progesterone (PROMETRIUM) 200 MG capsule Take 200 mg by mouth at bedtime. 09/01/21   [provider]  progesterone (PROMETRIUM) 200 MG capsule Take 1 capsule (200 mg total) by mouth at bedtime. 10/13/22     tretinoin (RETIN-A) 0.05 % cream Apply BB size amount to areas on face at bedtime 07/09/22   Marcelino Duster, MD  triamcinolone  cream (KENALOG) 0.1 % Apply to affected areas daily as needed 07/05/23   Marcelino Duster, MD    Family History Family History  Problem Relation Age of Onset   Colon polyps Mother    Hyperlipidemia Mother    Migraines Mother    Celiac disease Father        Chandra Batch   Leukemia Father    Hyperlipidemia Sister    Colon cancer Neg Hx    Headache Neg Hx     Social History Social History   Tobacco Use   Smoking status: Never   Smokeless tobacco: Never  Vaping Use   Vaping status: Never Used  Substance Use Topics   Alcohol use: Yes    Alcohol/week: 0.0 standard drinks of alcohol    Comment: occ   Drug  use: No     Allergies   Gluten meal and Levbid [hyoscyamine sulfate]   Review of Systems Review of Systems Per HPI  Physical Exam Triage Vital Signs ED Triage Vitals  Encounter Vitals Group     BP 11/05/23 0819 133/78     Systolic BP Percentile --      Diastolic BP Percentile --      Pulse Rate 11/05/23 0819 (!) 102     Resp 11/05/23 0819 18     Temp 11/05/23 0819 97.7 F (36.5 C)     Temp Source 11/05/23 0819 Oral     SpO2 11/05/23 0819 96 %     Weight --      Height --      Head Circumference --      Peak Flow --      Pain Score 11/05/23 0820 3     Pain Loc --      Pain Education --      Exclude from Growth Chart --    No data found.  Updated Vital Signs BP 133/78 (BP Location: Right Arm)   Pulse (!) 102   Temp 97.7 F (36.5 C) (Oral)   Resp 18   SpO2 96%   Visual Acuity Right Eye Distance:   Left Eye Distance:   Bilateral Distance:    Right Eye Near:   Left Eye Near:    Bilateral Near:     Physical Exam Vitals and nursing note reviewed.  Constitutional:      Appearance: Normal appearance.  HENT:     Head: Atraumatic.     Right Ear: Tympanic membrane and external ear normal.     Left Ear: Tympanic membrane and external ear normal.     Nose: Rhinorrhea present.     Mouth/Throat:     Mouth: Mucous membranes are moist.      Pharynx: Posterior oropharyngeal erythema present.  Eyes:     Extraocular Movements: Extraocular movements intact.     Conjunctiva/sclera: Conjunctivae normal.  Cardiovascular:     Rate and Rhythm: Normal rate and regular rhythm.     Heart sounds: Normal heart sounds.  Pulmonary:     Effort: Pulmonary effort is normal.     Breath sounds: Normal breath sounds. No wheezing or rales.  Musculoskeletal:        General: Normal range of motion.     Cervical back: Normal range of motion and neck supple.  Skin:    General: Skin is warm and dry.  Neurological:     Mental Status: She is alert and oriented to person, place, and time.  Psychiatric:        Mood and Affect: Mood normal.        Thought Content: Thought content normal.      UC Treatments / Results  Labs (all labs ordered are listed, but only abnormal results are displayed) Labs Reviewed - No data to display  EKG   Radiology No results found.  Procedures Procedures (including critical care time)  Medications Ordered in UC Medications - No data to display  Initial Impression / Assessment and Plan / UC Course  I have reviewed the triage vital signs and the nursing notes.  Pertinent labs & imaging results that were available during my care of the patient were reviewed by me and considered in my medical decision making (see chart for details).     Suspect seasonal allergy exacerbation causing bronchitis.  Treat with prednisone, Phenergan DM, antihistamines, supportive  over-the-counter medications and home care.  Return for worsening symptoms.  Final Clinical Impressions(s) / UC Diagnoses   Final diagnoses:  Seasonal allergic rhinitis due to other allergic trigger  Acute bronchitis, unspecified organism     Discharge Instructions      In addition to the prescribed medications, take nasal sprays like Flonase or Astelin twice daily, daily antihistamine such as Zyrtec or Xyzal and you may use sinus rinses,  over-the-counter cold and congestion medications as needed.    ED Prescriptions     Medication Sig Dispense Auth. Provider   predniSONE (DELTASONE) 20 MG tablet Take 2 tablets (40 mg total) by mouth daily with breakfast. 10 tablet Particia Nearing, PA-C   promethazine-dextromethorphan (PROMETHAZINE-DM) 6.25-15 MG/5ML syrup Take 5 mLs by mouth 4 (four) times daily as needed. 100 mL Particia Nearing, [place]      PDMP not reviewed this encounter.   Particia Nearing, [place] 11/05/23 1022

## 2023-11-05 NOTE — Discharge Instructions (Signed)
 In addition to the prescribed medications, take nasal sprays like Flonase or Astelin twice daily, daily antihistamine such as Zyrtec or Xyzal and you may use sinus rinses, over-the-counter cold and congestion medications as needed.

## 2023-11-05 NOTE — ED Triage Notes (Signed)
 Productive Cough for over a week.  Has been taking over the counter cough medication without relief.  Sore throat started 2 days ago.  No fevers.  Covid flu test last night was negative.

## 2023-11-26 ENCOUNTER — Other Ambulatory Visit (HOSPITAL_COMMUNITY): Payer: Self-pay

## 2023-11-26 ENCOUNTER — Encounter: Payer: Self-pay | Admitting: Neurology

## 2023-11-26 ENCOUNTER — Other Ambulatory Visit: Payer: Self-pay

## 2023-11-26 MED ORDER — PROGESTERONE 200 MG PO CAPS
200.0000 mg | ORAL_CAPSULE | Freq: Every evening | ORAL | 0 refills | Status: DC
  Filled 2023-11-26: qty 90, 90d supply, fill #0

## 2023-12-01 ENCOUNTER — Other Ambulatory Visit (HOSPITAL_COMMUNITY): Payer: Self-pay

## 2023-12-01 DIAGNOSIS — Z0000 Encounter for general adult medical examination without abnormal findings: Secondary | ICD-10-CM | POA: Diagnosis not present

## 2023-12-01 MED ORDER — AJOVY 225 MG/1.5ML ~~LOC~~ SOAJ
225.0000 mg | SUBCUTANEOUS | 11 refills | Status: AC
  Filled 2023-12-01 – 2024-02-08 (×4): qty 1.5, 30d supply, fill #0
  Filled 2024-03-14 – 2024-03-27 (×8): qty 1.5, 30d supply, fill #1
  Filled 2024-05-02: qty 1.5, 30d supply, fill #0
  Filled 2024-05-02: qty 1.5, 30d supply, fill #2
  Filled 2024-05-28 – 2024-05-29 (×4): qty 1.5, 30d supply, fill #1
  Filled 2024-07-04: qty 1.5, 30d supply, fill #2
  Filled 2024-07-28: qty 1.5, 30d supply, fill #3
  Filled 2024-08-29: qty 1.5, 30d supply, fill #4
  Filled 2024-09-29: qty 1.5, 30d supply, fill #5

## 2023-12-01 NOTE — Addendum Note (Signed)
 Addended by: Naomie Dean B on: 12/01/2023 10:54 AM   Modules accepted: Orders

## 2023-12-02 ENCOUNTER — Other Ambulatory Visit (HOSPITAL_COMMUNITY): Payer: Self-pay

## 2023-12-07 ENCOUNTER — Other Ambulatory Visit (HOSPITAL_COMMUNITY): Payer: Self-pay

## 2023-12-07 MED ORDER — PANTOPRAZOLE SODIUM 40 MG PO TBEC
DELAYED_RELEASE_TABLET | ORAL | 3 refills | Status: DC
  Filled 2023-12-07: qty 30, 30d supply, fill #0
  Filled 2024-01-12: qty 30, 30d supply, fill #1
  Filled 2024-02-09: qty 30, 30d supply, fill #2
  Filled 2024-03-15 – 2024-03-27 (×5): qty 30, 30d supply, fill #3

## 2023-12-09 ENCOUNTER — Other Ambulatory Visit (HOSPITAL_COMMUNITY): Payer: Self-pay

## 2023-12-10 ENCOUNTER — Other Ambulatory Visit: Payer: Self-pay

## 2023-12-14 ENCOUNTER — Other Ambulatory Visit (HOSPITAL_COMMUNITY): Payer: Self-pay

## 2023-12-15 DIAGNOSIS — Z0000 Encounter for general adult medical examination without abnormal findings: Secondary | ICD-10-CM | POA: Diagnosis not present

## 2023-12-15 DIAGNOSIS — Z0001 Encounter for general adult medical examination with abnormal findings: Secondary | ICD-10-CM | POA: Diagnosis not present

## 2023-12-15 DIAGNOSIS — G43009 Migraine without aura, not intractable, without status migrainosus: Secondary | ICD-10-CM | POA: Diagnosis not present

## 2023-12-15 DIAGNOSIS — K219 Gastro-esophageal reflux disease without esophagitis: Secondary | ICD-10-CM | POA: Diagnosis not present

## 2023-12-15 DIAGNOSIS — E782 Mixed hyperlipidemia: Secondary | ICD-10-CM | POA: Diagnosis not present

## 2023-12-20 ENCOUNTER — Other Ambulatory Visit: Payer: Self-pay

## 2023-12-20 ENCOUNTER — Telehealth: Payer: Self-pay

## 2023-12-20 ENCOUNTER — Other Ambulatory Visit (HOSPITAL_COMMUNITY): Payer: Self-pay

## 2023-12-20 NOTE — Telephone Encounter (Signed)
 Pharmacy Patient Advocate Encounter   Received notification from CoverMyMeds that prior authorization for AJOVY  (fremanezumab -vfrm) injection 225MG /1.5ML auto-injectors is required/requested.   Insurance verification completed.   The patient is insured through [facility] .   Per test claim: PA required; PA submitted to above mentioned insurance via CoverMyMeds Key/confirmation #/EOC [id number] Status is pending

## 2023-12-20 NOTE — Telephone Encounter (Signed)
 Pharmacy Patient Advocate Encounter  Received notification from [facility] that Prior Authorization for Ajovy  has been APPROVED from 12/20/2023 to 03/13/2024   PA #/Case ID/Reference #: [id number]

## 2023-12-24 ENCOUNTER — Other Ambulatory Visit: Payer: Self-pay

## 2023-12-24 ENCOUNTER — Other Ambulatory Visit (HOSPITAL_COMMUNITY): Payer: Self-pay

## 2023-12-28 DIAGNOSIS — M79672 Pain in left foot: Secondary | ICD-10-CM | POA: Diagnosis not present

## 2023-12-29 ENCOUNTER — Other Ambulatory Visit (HOSPITAL_COMMUNITY): Payer: Self-pay

## 2023-12-29 MED ORDER — PREDNISONE 10 MG PO TABS
ORAL_TABLET | ORAL | 0 refills | Status: AC
  Filled 2023-12-29 (×2): qty 21, 6d supply, fill #0

## 2024-01-03 ENCOUNTER — Other Ambulatory Visit (HOSPITAL_COMMUNITY): Payer: Self-pay

## 2024-01-03 ENCOUNTER — Other Ambulatory Visit: Payer: Self-pay

## 2024-01-03 DIAGNOSIS — Z01419 Encounter for gynecological examination (general) (routine) without abnormal findings: Secondary | ICD-10-CM | POA: Diagnosis not present

## 2024-01-03 DIAGNOSIS — R87610 Atypical squamous cells of undetermined significance on cytologic smear of cervix (ASC-US): Secondary | ICD-10-CM | POA: Diagnosis not present

## 2024-01-03 DIAGNOSIS — Z6835 Body mass index (BMI) 35.0-35.9, adult: Secondary | ICD-10-CM | POA: Diagnosis not present

## 2024-01-03 DIAGNOSIS — Z1231 Encounter for screening mammogram for malignant neoplasm of breast: Secondary | ICD-10-CM | POA: Diagnosis not present

## 2024-01-03 MED ORDER — PROGESTERONE 200 MG PO CAPS
200.0000 mg | ORAL_CAPSULE | Freq: Every day | ORAL | 3 refills | Status: AC
  Filled 2024-01-03 – 2024-02-29 (×2): qty 90, 90d supply, fill #0
  Filled 2024-05-28 – 2024-05-29 (×3): qty 90, 90d supply, fill #1
  Filled 2024-08-29: qty 90, 90d supply, fill #2

## 2024-01-03 MED ORDER — ESTRADIOL 0.05 MG/24HR TD PTWK
0.0500 mg | MEDICATED_PATCH | TRANSDERMAL | 3 refills | Status: AC
  Filled 2024-01-03: qty 12, 84d supply, fill #0
  Filled 2024-01-04: qty 4, 28d supply, fill #0
  Filled 2024-01-04: qty 12, 84d supply, fill #0
  Filled 2024-02-09: qty 4, 28d supply, fill #1
  Filled 2024-02-29 – 2024-03-01 (×2): qty 4, 28d supply, fill #2
  Filled 2024-04-07 – 2024-04-08 (×2): qty 4, 28d supply, fill #3
  Filled 2024-05-02: qty 4, 28d supply, fill #0
  Filled 2024-05-02: qty 4, 28d supply, fill #4
  Filled 2024-05-29: qty 4, 28d supply, fill #1
  Filled 2024-07-04: qty 4, 28d supply, fill #2
  Filled 2024-07-27: qty 4, 28d supply, fill #3
  Filled 2024-08-29: qty 4, 28d supply, fill #4
  Filled 2024-09-29: qty 4, 28d supply, fill #5

## 2024-01-04 ENCOUNTER — Other Ambulatory Visit (HOSPITAL_COMMUNITY): Payer: Self-pay

## 2024-01-11 DIAGNOSIS — M7742 Metatarsalgia, left foot: Secondary | ICD-10-CM | POA: Diagnosis not present

## 2024-01-12 ENCOUNTER — Other Ambulatory Visit (HOSPITAL_COMMUNITY): Payer: Self-pay

## 2024-02-02 ENCOUNTER — Telehealth: Payer: Self-pay | Admitting: Neurology

## 2024-02-02 ENCOUNTER — Telehealth (INDEPENDENT_AMBULATORY_CARE_PROVIDER_SITE_OTHER): Payer: BC Managed Care – PPO | Admitting: Neurology

## 2024-02-02 ENCOUNTER — Other Ambulatory Visit (HOSPITAL_BASED_OUTPATIENT_CLINIC_OR_DEPARTMENT_OTHER): Payer: Self-pay

## 2024-02-02 DIAGNOSIS — G43711 Chronic migraine without aura, intractable, with status migrainosus: Secondary | ICD-10-CM

## 2024-02-02 NOTE — Telephone Encounter (Signed)
 LVM asking pt to cb and schedule 6 month f/u.

## 2024-02-02 NOTE — Progress Notes (Signed)
 [facility]    Provider:  Dr Tresia Fruit Requesting Provider: Omie Bickers, MD Primary Care Provider:  Omie Bickers, MD  Virtual Visit via Video Note  I connected with Lisa Garner on 02/02/24 at  8:30 AM EDT by a video enabled telemedicine application and verified that I am speaking with the correct person using two identifiers.  Location: Patient: home Provider: office   I discussed the limitations of evaluation and management by telemedicine and the availability of in person appointments. The patient expressed understanding and agreed to proceed.    I discussed the assessment and treatment plan with the patient. The patient was provided an opportunity to ask questions and all were answered. The patient agreed with the plan and demonstrated an understanding of the instructions.   The patient was advised to call back or seek an in-person evaluation if the symptoms worsen or if the condition fails to improve as anticipated.  I provided 11 minutes of non-face-to-face time during this encounter.   Lisa Larsen, MD  02/02/2024: Ajovy  was approved for migraine prevention. Ajovy  was working. She did not think it was approved so stopped taking it, reviewed chart, 12/20/2023 and we got it straigtened out her copay is $15 per pharmacy    Pharmacy Patient Advocate Encounter   Received notification from [facility] that Prior Authorization for Ajovy  has been APPROVED from 12/20/2023 to 03/13/2024    PA #/Case ID/Reference #: [id number]      Will schedule a 6 month follow up. She will call us  in the meantime if Ajovy  not working.   Patient complains of symptoms per HPI as well as the following symptoms: none . Pertinent negatives and positives per HPI. All others negative   08/03/2023:  headache (unlike her migraines), holocephalic head pain and tingling, neck and shoulder pain, blurriness (transient visual obscurations but not severe),  she has tingling in the back  of the head. Tried nurtec and ubrelvy and did not help. Maxalt in the past made her very fatigued, also tried imitrex. Aimovig contraindicated bc of constipation. For her neck she has been to chiropractor and acupuncture.  Patient had a workup including MRI brain/MRV/LP all unremarkable, normal opening pressure. Headaches are most days and some days are very bad. Holocephalic.  Try Ajovy .   Reviewed notes, labs and imaging from outside physicians, which showed:  No results found for this or any previous visit (from the past 2160 hours).  LP 06/25/2023:  Narrative & Impression  CLINICAL DATA:  Chronic intractable headache. Facial tingling. Tinnitus and hearing loss. Vision changes. Suspected idiopathic intracranial hypertension.   EXAM: DIAGNOSTIC LUMBAR PUNCTURE UNDER FLUOROSCOPIC GUIDANCE   COMPARISON:  None Available.   FLUOROSCOPY: Radiation Exposure Index (as provided by the fluoroscopic device): 1.30 mGy Kerma   PROCEDURE: Informed consent was obtained from the patient prior to the procedure, including potential complications of headache, allergy, and pain. With the patient prone, the lower back was prepped with Betadine. 1% lidocaine  was used for local anesthesia. Lumbar puncture was performed at the L3-4 level via a left interlaminar approach using a 6 inch 20 gauge needle with return of clear, colorless CSF with an opening pressure of 18 cm water  (measured in the left lateral decubitus position). 10 mL of CSF were obtained for laboratory studies. Closing pressure was 13 cm water . The patient tolerated the procedure well, and there were no apparent complications.   IMPRESSION: Successful fluoroscopically-guided lumbar puncture. Opening pressure of 18 cm water .  MRI brain 06/03/2023 FINDINGS:  The brain parenchyma shows no abnormal signal intensities.  No structural lesion, tumor or infarct is noted.  Diffusion-weighted imaging is negative for acute ischemia.  T2 star  images shows no significant microhemorrhages.  There are few scattered nonspecific periventricular and subcortical T2/FLAIR white matter hyperintensities which may be seen in a variety of conditions including migraine headaches, remote trauma, small vessel disease, vasculitic, autoimmune, inflammatory or demyelinating conditions.  None of these show any postcontrast enhancement or involve the corpus callosum, brainstem or cerebellum.  There is a small benign left choroid fissure cyst noted.  Subarachnoid spaces and ventricular system appear normal.  Cortical sulci and gyri show normal appearance.  Extra-axial brain structures appear normal.  Calvarium showed no abnormalities.  Orbits appear unremarkable.  Paranasal sinuses show only minor chronic mucosal thickening.  The pituitary gland and cerebellar tonsils appear normal.  The flow-voids of large vessels are intact and circulation appear to be patent.  Postcontrast images do not result in abnormal areas of enhancement.  Visualized portion of the cervical spine shows mild disc degenerative changes at C3/4.         IMPRESSION: MRI scan of the brain with and without contrast showing nonspecific periventricular and subcortical white matter hyperintensities with the differential discussed above.  No enhancing lesions are noted.  CTV 06/16/2023: EXAM: CT VENOGRAM HEAD   TECHNIQUE: Venographic phase images of the brain were obtained following the administration of intravenous contrast. Multiplanar reformats and maximum intensity projections were generated.   RADIATION DOSE REDUCTION: This exam was performed according to the departmental dose-optimization program which includes automated exposure control, adjustment of the mA and/or kV according to patient size and/or use of iterative reconstruction technique.   CONTRAST:  80mL ISOVUE -370 IOPAMIDOL  (ISOVUE -370) INJECTION 76%   COMPARISON:  Head MRI 06/03/2023   FINDINGS: Noncontrast CT head:    There is no evidence of an acute infarct, intracranial hemorrhage, mass, midline shift, or extra-axial fluid collection. The ventricles and sulci are normal.   No skull fracture or suspicious osseous lesion is identified. The included paranasal sinuses and mastoid air cells are clear. The orbits are unremarkable.   CT venogram:   The superior sagittal sinus, internal cerebral veins, vein of Galen, straight sinus, transverse sinuses, sigmoid sinuses, and jugular bulbs are patent without evidence of thrombus or a significant focal stenosis. The right transverse and sigmoid sinuses are dominant, and the left transverse sinus is particularly hypoplastic.   IMPRESSION: Negative CT venogram.  Patient complains of symptoms per HPI as well as the following symptoms: none . Pertinent negatives and positives per HPI. All others negative    HPI:  Lisa Garner is a 53 y.o. female here as requested by Omie Bickers, MD for migraines. has Abdominal pain; Nausea; IBS (irritable bowel syndrome); Gastritis; Obesity, Class II, BMI 35-39.9, isolated; Heme + stool; GERD (gastroesophageal reflux disease); AP (abdominal pain); Occult blood in stools; Esophageal reflux; Dyspepsia; Abnormal Pap smear of cervix; Encounter for orthopedic follow-up care; Disorder of gallbladder; Dysphonia; Heart murmur; Pain in elbow; Pulmonary artery stenosis; Ulnar neuropathy; Irritable bowel syndrome; and Chronic migraine without aura, with intractable migraine, so stated, with status migrainosus on their problem list.   She has a constant pressure and tingling her head tingles in the back of the head but sometimes on the face. This is different her normal headaches/migraines. Tingling worse over use. She did have shingles on her face but this is not due to that bc it is  getting worse progressively. She has it in the back occipital , in the parient, in the frontal and on the cheeks getting worse, not really as much on the  forehead but when it gets bad it can be the whole face. She has neck pain constantly which is worse. She hasn;t had her usual migraines in a long time those started behind the left eye , she had phonophobia regular sounds would seem louder like a pen click and would hurt. Phon worse photo, would progress to little bit of nausea tried imitrex and maxalt and would wear her out.   Symptoms: smelling smoke when there is none, no seizure events with it but she only smells smoke or has to be really strong to smell not related to covid, tingling, ringing in the ears if she is laying down, feels like her head is full, she was dxed with PTs on midodrine , her etrogen was low and she is on a patch now. Her vision doesn't seem right episodes of not being able to focus. Neck pain. Constant headache pressue wakes with it goes to bed with it, a few years she has not felt well. She has been to the eye doctor her regular doctor. She is on acutane-like med called soriatane  a few years ago. 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 Sometimes she feels like she has to take a deep breath but she can't her lungs dont fill up she has to try again. No other focal neurologic deficits, associated symptoms, inciting events or modifiable factors.   Reviewed notes, labs and imaging from outside physicians, which showed: I do not see any imaging of the brain in epic or Care Everywhere, I did review cardiology reports Dr. Amanda Jungling from October 07, 2021 regarding an ED visit the day before for chest pain but troponins were negative EKG was sinus rhythm no acute changes and chest x-ray her showed no acute process, she had mid chest pressure pain into the right shoulder and lasted 4 to 5 hours with no coronary artery risk factors.  History of mild stenosis by prior echo or pulmonary valve.  She has a long history of  orthostatic dizziness, long history of symptoms, symptoms August 2020, severe dizziness nausea vomiting, low blood pressure at that time 90s over 60s, symptoms with standing, 5 to 10 minutes, feels hot starts to feel dizzy and hard increases sit down and feels better she was started on midodrine  for that and had Florinef in the past no side effects but ended up stopping.  In her ED visit her heart rate increased from 70-1 09 with standing but she had no significant blood pressure changes, amitriptyline  can cause orthostatic hypotension but time he does not seem to line up.  His exam was normal.  Diagnosed with orthostatic dizziness for her dizzy signs, symptoms consistent with POTs and she was encouraged to use compression stockings, aggressive oral hydration and sodium intake and midodrine .  From a review of records and patient reported history medications tried > 3 months that can be used in migraine/headache management include: Amitriptyline , mag gluconate 500 mg daily, meloxicam, Zofran , ibuprofen and various over-the-counter analgesics.  Nurtec.  Tizanidine.        Latest Ref Rng & Units 05/17/2023    4:28 PM 10/06/2021   12:32 PM 04/09/2020    9:32 AM  CBC  WBC 3.4 - 10.8 x10E3/uL 12.4  8.1  10.1   Hemoglobin 11.1 - 15.9 g/dL 16.1  09.6  04.5   Hematocrit 34.0 - 46.6 % 41.7  41.3  40.7   Platelets 150 - 450 x10E3/uL 353  305  316       Latest Ref Rng & Units 05/17/2023    4:28 PM 10/06/2021   12:32 PM 04/09/2020    9:32 AM  CMP  Glucose 70 - 99 mg/dL 83  94  90   BUN 6 - 24 mg/dL 17  10  12    Creatinine 0.57 - 1.00 mg/dL 4.09  8.11  9.14   Sodium 134 - 144 mmol/L 140  138  140   Potassium 3.5 - 5.2 mmol/L 4.4  4.0  4.3   Chloride 96 - 106 mmol/L 98  102  104   CO2 20 - 29 mmol/L 23  29  25    Calcium 8.7 - 10.2 mg/dL 9.9  9.0  9.0   Total Protein 6.0 - 8.5 g/dL 7.4  6.7  6.5   Total Bilirubin 0.0 - 1.2 mg/dL <7.8  0.2  0.3   Alkaline Phos 44 - 121 IU/L 80  60    AST 0 - 40 IU/L 29  21   19    ALT 0 - 32 IU/L 19  16  13      I do not see a recent TSH in epic.  No labs, imaging or reports or physician notes in Care Everywhere.   Review of Systems: Patient complains of symptoms per HPI as well as the following symptoms none. Pertinent negatives and positives per HPI. All others negative.   Social History   Socioeconomic History   Marital status: Married    Spouse name: Not on file   Number of children: 2   Years of education: Not on file   Highest education level: Not on file  Occupational History   Occupation: Camera operator pharmacy  Tobacco Use   Smoking status: Never   Smokeless tobacco: Never  Vaping Use   Vaping status: Never Used  Substance and Sexual Activity   Alcohol use: Yes    Alcohol/week: 0.0 standard drinks of alcohol    Comment: occ   Drug use: No   Sexual activity: Yes    Partners: Male    Comment: No birth control  Other Topics Concern   Not on file  Social History Narrative   Lives at home with husband.  He has two children.    Social Drivers of Corporate investment banker Strain: Not on file  Food Insecurity: Not on file  Transportation Needs: Not on file  Physical Activity: Not on file  Stress: Not on file  Social Connections: Not on file  Intimate Partner Violence: Not on file    Family History  Problem Relation Age of Onset   Colon polyps Mother    Hyperlipidemia Mother    Migraines Mother    Celiac disease Father        Lisa Garner   Leukemia Father    Hyperlipidemia Sister    Colon cancer Neg Hx    Headache Neg Hx     Past Medical History:  Diagnosis Date   Claudication [facility])    Lower arterial duplex scan 01/05/08 - Normal   Congenital pulmonary stenosis    mild   Fatty liver    GERD (gastroesophageal reflux disease)    Helicobacter pylori gastritis 2005   IBS (irritable  bowel syndrome)    Pulmonary hypertension (HCC)    TTE 10/23/11 - LV size normal. LV EF 50-55%   S/P endoscopy 05/25/08   Dr  Nannette Babe small bowel biopsy, reactive gastropathy   Skin disorder     Patient Active Problem List   Diagnosis Date Noted   Chronic migraine without aura, with intractable migraine, so stated, with status migrainosus 08/03/2023   Disorder of gallbladder 10/06/2021   Pulmonary artery stenosis 10/06/2021   Irritable bowel syndrome 10/06/2021   Abnormal Pap smear of cervix 03/20/2021   Heart murmur 03/20/2021   Dyspepsia 12/26/2020   Encounter for orthopedic follow-up care 07/19/2019   Pain in elbow 06/15/2019   Ulnar neuropathy 06/15/2019   Dysphonia 03/12/2017   AP (abdominal pain)    Occult blood in stools    Esophageal reflux    Heme + stool 12/28/2014   GERD (gastroesophageal reflux disease) 12/28/2014   Gastritis 08/20/2011   Obesity, Class II, BMI 35-39.9, isolated 08/20/2011   IBS (irritable bowel syndrome) 05/29/2011   Abdominal pain 05/21/2011   Nausea 05/21/2011    Past Surgical History:  Procedure Laterality Date   arm surgery     BIOPSY  04/19/2020   Procedure: BIOPSY;  Surgeon: Vinetta Greening, DO;  Location: [facility];  Service: Endoscopy;;  duodenal   CHOLECYSTECTOMY  1997   ERCP with sphincterotomy/stone extraction   COLONOSCOPY N/A 01/18/2015   2 colon polyps, examined TI normal. Small internal hemorrhoids. Tubular adenoma   COLONOSCOPY WITH PROPOFOL  N/A 04/19/2020   Non-bleeding internal hemorrhoids, TI normal. 7 years surveillance   DILATION AND CURETTAGE OF UTERUS     ENDOMETRIAL ABLATION     ESOPHAGOGASTRODUODENOSCOPY  05/25/2008   NGE:[id number] small bowel    ESOPHAGOGASTRODUODENOSCOPY N/A 01/18/2015   multiple gastric polyps in gastric fundus and cardiac, mild non-erosive gastritis. No Hpylori    ESOPHAGOGASTRODUODENOSCOPY (EGD) WITH PROPOFOL  N/A 04/19/2020   Gastritis, multiple gastric polyps, normal duodenum. Negative villous changes. Negative H.pylori.    KNEE SURGERY Left 02/1989   TONSILLECTOMY     WISDOM TOOTH EXTRACTION  11/1989     Current Outpatient Medications  Medication Sig Dispense Refill   acitretin  (SORIATANE ) 10 MG capsule Take 10 mg by mouth daily before breakfast.     acitretin  (SORIATANE ) 10 MG capsule Take 1 capsule (10 mg total) by mouth daily. 30 capsule 3   acitretin  (SORIATANE ) 10 MG capsule Take 1 capsule (10 mg total) by mouth daily. 30 capsule 3   acitretin  (SORIATANE ) 10 MG capsule Take 1 capsule (10 mg total) by mouth daily. 30 capsule 3   Biotin w/ Vitamins C & E (HAIR/SKIN/NAILS PO) Take 3 tablets by mouth daily.     Calcium Citrate-Vitamin D (CALCIUM + D PO) Take 2 tablets by mouth daily.     Cholecalciferol (DIALYVITE VITAMIN D 5000) 125 MCG (5000 UT) capsule Take 5,000 Units by mouth daily.     estradiol  (CLIMARA  - DOSED IN MG/24 HR) 0.025 mg/24hr patch Apply 1 patch every week as directed 12 patch 3   estradiol  (CLIMARA  - DOSED IN MG/24 HR) 0.05 mg/24hr patch Place 1 patch (0.05 mg total) onto the skin once a week. 12 patch 3   Fremanezumab -vfrm (AJOVY ) 225 MG/1.5ML SOAJ Inject 225 mg into the skin every 30 (thirty) days. Please run copay card: BIN# [id number] PCN# [id number] GRP# [id number] ID# [id number] EXP 08/30/2024 1.5 mL 11   ibuprofen (ADVIL) 800 MG tablet Take 800 mg by mouth at bedtime as needed for  moderate pain.      ketoconazole  (NIZORAL ) 2 % cream Apply topically once to twice daily as needed for groin creases 60 g 3   levocetirizine (XYZAL) 5 MG tablet Take 5 mg by mouth daily.     MAGNESIUM GLYCINATE PO Take 400 mg by mouth daily.     meloxicam (MOBIC) 15 MG tablet Take 15 mg by mouth daily as needed for pain.     midodrine  (PROAMATINE ) 5 MG tablet TAKE (1) TABLET BY MOUTH THREE TIMES A DAY. 90 tablet 5   midodrine  (PROAMATINE ) 5 MG tablet Take 1 tablet (5 mg total) by mouth 3 (three) times daily. 90 tablet 6   ondansetron  (ZOFRAN  ODT) 4 MG disintegrating tablet Take 1 tablet (4 mg total) by mouth every 8 (eight) hours as needed for nausea or vomiting. 30 tablet 3   pantoprazole   (PROTONIX ) 40 MG tablet TAKE ONE TABLET BY MOUTH ONCE DAILY. 90 tablet 0   pantoprazole  (PROTONIX ) 40 MG tablet Take 1 tablet (40 mg total) by mouth daily. Patient needs appointment for future refills 30 tablet 3   predniSONE  (DELTASONE ) 20 MG tablet Take 2 tablets (40 mg total) by mouth daily with breakfast. 10 tablet 0   predniSONE  (DELTASONE ) 10 MG tablet Take 6 tab x 1 day; Take 5 tab x 1 day; Take 4 tab x 1 day; Take 3 tab x 1 day; Take 2 tab x 1 day & then take 1 tab x 1 day. 21 tablet 0   progesterone  (PROMETRIUM ) 200 MG capsule Take 200 mg by mouth at bedtime.     progesterone  (PROMETRIUM ) 200 MG capsule Take 1 capsule (200 mg total) by mouth at bedtime. 90 capsule 3   promethazine -dextromethorphan (PROMETHAZINE -DM) 6.25-15 MG/5ML syrup Take 5 mLs by mouth 4 (four) times daily as needed. 100 mL 0   tretinoin  (RETIN-A ) 0.05 % cream Apply BB size amount to areas on face at bedtime 45 g 1   triamcinolone  cream (KENALOG ) 0.1 % Apply to affected areas daily as needed 80 g 3   No current facility-administered medications for this visit.    Allergies as of 02/02/2024 - Review Complete 11/05/2023  Allergen Reaction Noted   Gluten meal  03/25/2020   Levbid [hyoscyamine sulfate] Rash 05/21/2011    Vitals: There were no vitals taken for this visit. Last Weight:  Wt Readings from Last 1 Encounters:  08/03/23 210 lb (95.3 kg)   Last Height:   Ht Readings from Last 1 Encounters:  08/03/23 5\' 5"  (1.651 m)     Physical exam: Exam: Gen: NAD, conversant      CV: No palpitations or chest pain or SOB. VS: Breathing at a normal rate. Not febrile. Eyes: Conjunctivae clear without exudates or hemorrhage  Neuro: Detailed Neurologic Exam  Speech:    Speech is normal; fluent and spontaneous with normal comprehension.  Cognition:    The patient is oriented to person, place, and time;     recent and remote memory intact;     language fluent;     normal attention, concentration, fund of  knowledge Cranial Nerves:    The pupils are equal, round, and reactive to light. Visual fields are full Extraocular movements are intact.  The face is symmetric with normal sensation. The palate elevates in the midline. Hearing intact. Voice is normal. Shoulder shrug is normal. The tongue has normal motion without fasciculations.   Coordination: normal  Gait:    No abnormalities noted or reported  Motor Observation:  no involuntary movements noted. Tone:    Appears normal  Posture:    Posture is normal. normal erect    Strength:    Strength is anti-gravity and symmetric in the upper and lower limbs.      Sensation: intact to LT, no reports of numbness or tingling or paresthesias        Assessment/Plan:  patient with suspected IDIOPATHIC INTRACRANIAL HYPERTENSION due to headache but WORKUP NORMAL did not show IDIOPATHIC INTRACRANIAL HYPERTENSION. Her headaches reported (unlike her migraines), holocephalic head pain and tingling, neck and shoulder pain, blurriness (transient visual obscurations but not severe), intractable headache, obesity, also she is on multiple medications such as midodrine  for POTS and oral vitamin A due to a rare hereditary skin disorder that can likely cause or worsen intracranial hypertension but again her workup was normal including LP opening pressure.  However these are medications that are needed and patient would be hesitant to discontinue them.    Patient had a workup including MRI brain/MRV/LP all unremarkable, normal opening pressure. Headaches and migraines are still most days and some days are very bad. Holocephalic.  Try Ajovy . > 8 migraine days a month and > 25 total headache days a month.   02/02/2024: Ajovy  was approved for migraine prevention. Ajovy  was working as gave her samples. She did not think it was approved so stopped taking it, reviewed chart, 12/20/2023 and we got it straigtened out her copay is $15 per pharmacy    Pharmacy Patient  Advocate Encounter   Received notification from [facility] that Prior Authorization for Ajovy  has been APPROVED from 12/20/2023 to 03/13/2024    PA #/Case ID/Reference #: [id number]      Will schedule a 6 month follow up. She will call us  in the meantime if Ajovy  not working.   Patient complains of symptoms per HPI as well as the following symptoms: none . Pertinent negatives and positives per HPI. All others negative   No orders of the defined types were placed in this encounter.   Cc: Omie Bickers, MD,  Omie Bickers, MD  Aldona Amel, MD  [facility] [address] [place], [place] [address]  Phone [phone] Fax [phone]

## 2024-02-02 NOTE — Telephone Encounter (Signed)
 Please schedule video f/u 6 months please with dr Tresia Fruit thank you

## 2024-02-08 ENCOUNTER — Other Ambulatory Visit (HOSPITAL_COMMUNITY): Payer: Self-pay

## 2024-02-08 ENCOUNTER — Other Ambulatory Visit (HOSPITAL_BASED_OUTPATIENT_CLINIC_OR_DEPARTMENT_OTHER): Payer: Self-pay

## 2024-02-09 ENCOUNTER — Other Ambulatory Visit (HOSPITAL_COMMUNITY): Payer: Self-pay

## 2024-02-11 ENCOUNTER — Other Ambulatory Visit (HOSPITAL_COMMUNITY): Payer: Self-pay

## 2024-02-29 ENCOUNTER — Other Ambulatory Visit (HOSPITAL_COMMUNITY): Payer: Self-pay

## 2024-02-29 ENCOUNTER — Other Ambulatory Visit: Payer: Self-pay | Admitting: Cardiology

## 2024-02-29 MED FILL — Midodrine HCl Tab 5 MG: ORAL | 30 days supply | Qty: 90 | Fill #0 | Status: AC

## 2024-03-01 ENCOUNTER — Other Ambulatory Visit: Payer: Self-pay

## 2024-03-01 ENCOUNTER — Other Ambulatory Visit (HOSPITAL_COMMUNITY): Payer: Self-pay

## 2024-03-07 ENCOUNTER — Other Ambulatory Visit (HOSPITAL_BASED_OUTPATIENT_CLINIC_OR_DEPARTMENT_OTHER): Payer: Self-pay

## 2024-03-13 ENCOUNTER — Other Ambulatory Visit (HOSPITAL_BASED_OUTPATIENT_CLINIC_OR_DEPARTMENT_OTHER): Payer: Self-pay

## 2024-03-14 ENCOUNTER — Other Ambulatory Visit (HOSPITAL_BASED_OUTPATIENT_CLINIC_OR_DEPARTMENT_OTHER): Payer: Self-pay

## 2024-03-15 ENCOUNTER — Other Ambulatory Visit (HOSPITAL_BASED_OUTPATIENT_CLINIC_OR_DEPARTMENT_OTHER): Payer: Self-pay

## 2024-03-16 ENCOUNTER — Other Ambulatory Visit (HOSPITAL_BASED_OUTPATIENT_CLINIC_OR_DEPARTMENT_OTHER): Payer: Self-pay

## 2024-03-17 ENCOUNTER — Other Ambulatory Visit (HOSPITAL_BASED_OUTPATIENT_CLINIC_OR_DEPARTMENT_OTHER): Payer: Self-pay

## 2024-03-20 ENCOUNTER — Other Ambulatory Visit (HOSPITAL_BASED_OUTPATIENT_CLINIC_OR_DEPARTMENT_OTHER): Payer: Self-pay

## 2024-03-21 ENCOUNTER — Other Ambulatory Visit (HOSPITAL_BASED_OUTPATIENT_CLINIC_OR_DEPARTMENT_OTHER): Payer: Self-pay

## 2024-03-22 ENCOUNTER — Other Ambulatory Visit (HOSPITAL_BASED_OUTPATIENT_CLINIC_OR_DEPARTMENT_OTHER): Payer: Self-pay

## 2024-03-23 ENCOUNTER — Other Ambulatory Visit (HOSPITAL_BASED_OUTPATIENT_CLINIC_OR_DEPARTMENT_OTHER): Payer: Self-pay

## 2024-03-24 ENCOUNTER — Other Ambulatory Visit (HOSPITAL_BASED_OUTPATIENT_CLINIC_OR_DEPARTMENT_OTHER): Payer: Self-pay

## 2024-03-27 ENCOUNTER — Other Ambulatory Visit (HOSPITAL_COMMUNITY): Payer: Self-pay

## 2024-03-27 ENCOUNTER — Other Ambulatory Visit (HOSPITAL_BASED_OUTPATIENT_CLINIC_OR_DEPARTMENT_OTHER): Payer: Self-pay

## 2024-03-28 ENCOUNTER — Telehealth: Payer: Self-pay | Admitting: Pharmacist

## 2024-03-28 ENCOUNTER — Other Ambulatory Visit (HOSPITAL_COMMUNITY): Payer: Self-pay

## 2024-03-28 ENCOUNTER — Other Ambulatory Visit (HOSPITAL_BASED_OUTPATIENT_CLINIC_OR_DEPARTMENT_OTHER): Payer: Self-pay

## 2024-03-28 NOTE — Telephone Encounter (Signed)
 Pharmacy Patient Advocate Encounter   Received notification from Physician's Office that prior authorization for AJOVY  (fremanezumab -vfrm) injection 225MG /1.5ML auto-injectors is required/requested.   Insurance verification completed.   The patient is insured through [facility] .   Per test claim: PA required; PA submitted to above mentioned insurance via CoverMyMeds Key/confirmation #/EOC [id number] Status is pending

## 2024-03-29 NOTE — Telephone Encounter (Signed)
 Pharmacy Patient Advocate Encounter  Received notification from [facility] that Prior Authorization for AJOVY  (fremanezumab -vfrm) injection 225MG /1.5ML auto-injectors has been APPROVED from 03/28/2024 to 03/28/2025   PA #/Case ID/Reference #: [id number]

## 2024-03-31 ENCOUNTER — Other Ambulatory Visit (HOSPITAL_BASED_OUTPATIENT_CLINIC_OR_DEPARTMENT_OTHER): Payer: Self-pay

## 2024-04-03 ENCOUNTER — Other Ambulatory Visit (HOSPITAL_BASED_OUTPATIENT_CLINIC_OR_DEPARTMENT_OTHER): Payer: Self-pay

## 2024-04-05 ENCOUNTER — Other Ambulatory Visit (HOSPITAL_BASED_OUTPATIENT_CLINIC_OR_DEPARTMENT_OTHER): Payer: Self-pay

## 2024-04-07 ENCOUNTER — Other Ambulatory Visit (HOSPITAL_BASED_OUTPATIENT_CLINIC_OR_DEPARTMENT_OTHER): Payer: Self-pay

## 2024-04-08 ENCOUNTER — Other Ambulatory Visit (HOSPITAL_COMMUNITY): Payer: Self-pay

## 2024-04-10 ENCOUNTER — Other Ambulatory Visit (HOSPITAL_BASED_OUTPATIENT_CLINIC_OR_DEPARTMENT_OTHER): Payer: Self-pay

## 2024-04-11 ENCOUNTER — Other Ambulatory Visit (HOSPITAL_BASED_OUTPATIENT_CLINIC_OR_DEPARTMENT_OTHER): Payer: Self-pay

## 2024-04-12 ENCOUNTER — Other Ambulatory Visit (HOSPITAL_BASED_OUTPATIENT_CLINIC_OR_DEPARTMENT_OTHER): Payer: Self-pay

## 2024-04-13 ENCOUNTER — Other Ambulatory Visit (HOSPITAL_BASED_OUTPATIENT_CLINIC_OR_DEPARTMENT_OTHER): Payer: Self-pay

## 2024-04-14 ENCOUNTER — Other Ambulatory Visit (HOSPITAL_BASED_OUTPATIENT_CLINIC_OR_DEPARTMENT_OTHER): Payer: Self-pay

## 2024-04-18 ENCOUNTER — Other Ambulatory Visit (HOSPITAL_COMMUNITY): Payer: Self-pay

## 2024-04-18 ENCOUNTER — Other Ambulatory Visit (HOSPITAL_BASED_OUTPATIENT_CLINIC_OR_DEPARTMENT_OTHER): Payer: Self-pay

## 2024-04-19 ENCOUNTER — Other Ambulatory Visit (HOSPITAL_BASED_OUTPATIENT_CLINIC_OR_DEPARTMENT_OTHER): Payer: Self-pay

## 2024-04-20 ENCOUNTER — Other Ambulatory Visit (HOSPITAL_BASED_OUTPATIENT_CLINIC_OR_DEPARTMENT_OTHER): Payer: Self-pay

## 2024-04-20 ENCOUNTER — Other Ambulatory Visit: Payer: Self-pay | Admitting: Cardiology

## 2024-04-21 ENCOUNTER — Other Ambulatory Visit (HOSPITAL_COMMUNITY): Payer: Self-pay

## 2024-04-21 ENCOUNTER — Other Ambulatory Visit (HOSPITAL_BASED_OUTPATIENT_CLINIC_OR_DEPARTMENT_OTHER): Payer: Self-pay

## 2024-04-21 MED ORDER — MIDODRINE HCL 5 MG PO TABS
5.0000 mg | ORAL_TABLET | Freq: Three times a day (TID) | ORAL | 0 refills | Status: DC
  Filled 2024-04-21 (×2): qty 45, 15d supply, fill #0

## 2024-04-25 ENCOUNTER — Other Ambulatory Visit (HOSPITAL_BASED_OUTPATIENT_CLINIC_OR_DEPARTMENT_OTHER): Payer: Self-pay

## 2024-04-25 MED ORDER — PANTOPRAZOLE SODIUM 40 MG PO TBEC
40.0000 mg | DELAYED_RELEASE_TABLET | Freq: Every day | ORAL | 3 refills | Status: DC
  Filled 2024-04-25 – 2024-04-26 (×2): qty 30, 30d supply, fill #0
  Filled 2024-05-15: qty 30, 30d supply, fill #1
  Filled 2024-07-04: qty 30, 30d supply, fill #2
  Filled 2024-07-28: qty 30, 30d supply, fill #3

## 2024-04-26 ENCOUNTER — Other Ambulatory Visit (HOSPITAL_BASED_OUTPATIENT_CLINIC_OR_DEPARTMENT_OTHER): Payer: Self-pay

## 2024-04-28 ENCOUNTER — Other Ambulatory Visit (HOSPITAL_BASED_OUTPATIENT_CLINIC_OR_DEPARTMENT_OTHER): Payer: Self-pay

## 2024-05-02 ENCOUNTER — Other Ambulatory Visit (HOSPITAL_BASED_OUTPATIENT_CLINIC_OR_DEPARTMENT_OTHER): Payer: Self-pay

## 2024-05-02 ENCOUNTER — Other Ambulatory Visit: Payer: Self-pay | Admitting: Cardiology

## 2024-05-03 ENCOUNTER — Other Ambulatory Visit (HOSPITAL_BASED_OUTPATIENT_CLINIC_OR_DEPARTMENT_OTHER): Payer: Self-pay

## 2024-05-03 MED ORDER — MIDODRINE HCL 5 MG PO TABS
ORAL_TABLET | ORAL | 0 refills | Status: AC
  Filled 2024-05-03: qty 45, 15d supply, fill #0

## 2024-05-15 ENCOUNTER — Other Ambulatory Visit (HOSPITAL_BASED_OUTPATIENT_CLINIC_OR_DEPARTMENT_OTHER): Payer: Self-pay

## 2024-05-16 ENCOUNTER — Other Ambulatory Visit (HOSPITAL_BASED_OUTPATIENT_CLINIC_OR_DEPARTMENT_OTHER): Payer: Self-pay

## 2024-05-19 ENCOUNTER — Other Ambulatory Visit (HOSPITAL_BASED_OUTPATIENT_CLINIC_OR_DEPARTMENT_OTHER): Payer: Self-pay

## 2024-05-23 ENCOUNTER — Other Ambulatory Visit (HOSPITAL_BASED_OUTPATIENT_CLINIC_OR_DEPARTMENT_OTHER): Payer: Self-pay

## 2024-05-23 MED ORDER — ZOSTER VAC RECOMB ADJUVANTED 50 MCG/0.5ML IM SUSR
INTRAMUSCULAR | 1 refills | Status: AC
  Filled 2024-09-29: qty 0.5, 1d supply, fill #0

## 2024-05-26 ENCOUNTER — Other Ambulatory Visit (HOSPITAL_BASED_OUTPATIENT_CLINIC_OR_DEPARTMENT_OTHER): Payer: Self-pay

## 2024-05-26 MED ORDER — FLUZONE 0.5 ML IM SUSY
0.5000 mL | PREFILLED_SYRINGE | Freq: Once | INTRAMUSCULAR | 0 refills | Status: AC
Start: 2024-05-26 — End: 2024-05-27
  Filled 2024-05-26: qty 0.5, 1d supply, fill #0

## 2024-05-28 ENCOUNTER — Other Ambulatory Visit (HOSPITAL_COMMUNITY): Payer: Self-pay

## 2024-05-29 ENCOUNTER — Other Ambulatory Visit (HOSPITAL_COMMUNITY): Payer: Self-pay

## 2024-05-29 ENCOUNTER — Other Ambulatory Visit: Payer: Self-pay

## 2024-05-29 ENCOUNTER — Other Ambulatory Visit (HOSPITAL_BASED_OUTPATIENT_CLINIC_OR_DEPARTMENT_OTHER): Payer: Self-pay

## 2024-05-30 ENCOUNTER — Other Ambulatory Visit (HOSPITAL_COMMUNITY): Payer: Self-pay

## 2024-06-01 ENCOUNTER — Other Ambulatory Visit (HOSPITAL_BASED_OUTPATIENT_CLINIC_OR_DEPARTMENT_OTHER): Payer: Self-pay

## 2024-06-09 ENCOUNTER — Other Ambulatory Visit (HOSPITAL_BASED_OUTPATIENT_CLINIC_OR_DEPARTMENT_OTHER): Payer: Self-pay

## 2024-06-09 DIAGNOSIS — M5412 Radiculopathy, cervical region: Secondary | ICD-10-CM | POA: Diagnosis not present

## 2024-06-09 DIAGNOSIS — G8929 Other chronic pain: Secondary | ICD-10-CM | POA: Diagnosis not present

## 2024-06-09 DIAGNOSIS — M25512 Pain in left shoulder: Secondary | ICD-10-CM | POA: Diagnosis not present

## 2024-06-09 MED ORDER — METHYLPREDNISOLONE 4 MG PO TBPK
ORAL_TABLET | ORAL | 0 refills | Status: AC
  Filled 2024-06-09: qty 21, 6d supply, fill #0

## 2024-06-09 MED ORDER — TIZANIDINE HCL 4 MG PO TABS
2.0000 mg | ORAL_TABLET | Freq: Three times a day (TID) | ORAL | 1 refills | Status: AC | PRN
  Filled 2024-06-09: qty 30, 20d supply, fill #0
  Filled 2024-08-29: qty 30, 20d supply, fill #1

## 2024-06-16 NOTE — Therapy (Signed)
 OUTPATIENT PHYSICAL THERAPY EVALUATION   Patient Name: Lisa Garner MRN: [id number] DOB:July 02, 1971, 53 y.o., female Today's Date: 06/19/2024  END OF SESSION:  PT End of Session - 06/19/24 0832     Visit Number 1    Number of Visits 10    Date for Recertification  07/31/24    PT Start Time 0800    PT Stop Time 0845    PT Time Calculation (min) 45 min    Activity Tolerance Patient tolerated treatment well    Behavior During Therapy [facility] for tasks assessed/performed          Past Medical History:  Diagnosis Date   Claudication    Lower arterial duplex scan 01/05/08 - Normal   Congenital pulmonary stenosis    mild   Fatty liver    GERD (gastroesophageal reflux disease)    Helicobacter pylori gastritis 2005   IBS (irritable bowel syndrome)    Pulmonary hypertension (HCC)    TTE 10/23/11 - LV size normal. LV EF 50-55%   S/P endoscopy 05/25/08   Dr Vevelyn small bowel biopsy, reactive gastropathy   Skin disorder    Past Surgical History:  Procedure Laterality Date   arm surgery     BIOPSY  04/19/2020   Procedure: BIOPSY;  Surgeon: Cindie Carlin POUR, DO;  Location: [facility];  Service: Endoscopy;;  duodenal   CHOLECYSTECTOMY  1997   ERCP with sphincterotomy/stone extraction   COLONOSCOPY N/A 01/18/2015   2 colon polyps, examined TI normal. Small internal hemorrhoids. Tubular adenoma   COLONOSCOPY WITH PROPOFOL  N/A 04/19/2020   Non-bleeding internal hemorrhoids, TI normal. 7 years surveillance   DILATION AND CURETTAGE OF UTERUS     ENDOMETRIAL ABLATION     ESOPHAGOGASTRODUODENOSCOPY  05/25/2008   DOQ:wnmfjo small bowel    ESOPHAGOGASTRODUODENOSCOPY N/A 01/18/2015   multiple gastric polyps in gastric fundus and cardiac, mild non-erosive gastritis. No Hpylori    ESOPHAGOGASTRODUODENOSCOPY (EGD) WITH PROPOFOL  N/A 04/19/2020   Gastritis, multiple gastric polyps, normal duodenum. Negative villous changes. Negative H.pylori.    KNEE SURGERY Left 02/1989    TONSILLECTOMY     WISDOM TOOTH EXTRACTION  11/1989   Patient Active Problem List   Diagnosis Date Noted   Chronic migraine without aura, with intractable migraine, so stated, with status migrainosus 08/03/2023   Disorder of gallbladder 10/06/2021   Pulmonary artery stenosis 10/06/2021   Irritable bowel syndrome 10/06/2021   Abnormal Pap smear of cervix 03/20/2021   Heart murmur 03/20/2021   Dyspepsia 12/26/2020   Encounter for orthopedic follow-up care 07/19/2019   Pain in elbow 06/15/2019   Ulnar neuropathy 06/15/2019   Dysphonia 03/12/2017   [facility] (abdominal pain)    Occult blood in stools    Esophageal reflux    Heme + stool 12/28/2014   GERD (gastroesophageal reflux disease) 12/28/2014   Gastritis 08/20/2011   Obesity, Class II, BMI 35-39.9, isolated 08/20/2011   IBS (irritable bowel syndrome) 05/29/2011   Abdominal pain 05/21/2011   Nausea 05/21/2011    PCP: Shona Norleen PEDLAR, MD   REFERRING PROVIDER: Reyne Cordella SQUIBB, MD   REFERRING DIAG: [phone] (ICD-10-CM) - Radiculopathy, cervical region   Rationale for Evaluation and Treatment:  Rehabiliation  THERAPY DIAG:  Cervicalgia  Muscle weakness (generalized)  ONSET DATE: 2 week onset of pain   SUBJECTIVE:  SUBJECTIVE STATEMENT: She reports neck and left shoulder pain without any known cause or injury. She does get some N/T in her left hand and fingers. Feels her left arm is harder to raise up now. She has a having a hard time finding a comfortable position.   PERTINENT HISTORY:  See above PMH  PAIN:  NPRS scale: 3/10 upon arrival Pain location:Constant Pain description: constant, annoying, Aggravating factors: Turning to the right, looking up,  Relieving factors: rest, NSAIDS PRECAUTIONS: ,  None  RED FLAGS: None   WEIGHT  BEARING RESTRICTIONS:  No  FALLS:  Has patient fallen in last 6 months? No   OCCUPATION:  Pharmacist  PLOF:  Independent  PATIENT GOALS:  Reduce pain  OBJECTIVE:  Note: Objective measures were completed at Evaluation unless otherwise noted.  DIAGNOSTIC FINDINGS:  She reports arthritis and compression of Xray  PATIENT SURVEYS:  Patient-Specific Activity Scoring Scheme  0 represents "unable to perform." 10 represents "able to perform at prior level. 0 1 2 3 4 5 6 7 8 9  10 (Date and Score)   Activity Eval     1. turning 5    2. Computer work  4    3.     4.    5.    Score 4.5    Total score = sum of the activity scores/number of activities Minimum detectable change (90%CI) for average score = 2 points Minimum detectable change (90%CI) for single activity score = 3 points     EDEMA:  No    CERVICAL ROM:   Active ROM A/PROM (deg) eval  Flexion 30  Extension 30  Right lateral flexion 30  Left lateral flexion 30  Right rotation 60  Left rotation 55   (Blank rows = not tested)   UPPER EXTREMITY ROM: WNL ROM but painful in left arm with these movements     UPPER EXTREMITY MMT:  MMT Right eval Left eval  Shoulder flexion 5 4  Shoulder extension    Shoulder abduction 5 4  Shoulder adduction    Shoulder extension    Shoulder internal rotation 5 5  Shoulder external rotation 5 5  Middle trapezius    Lower trapezius    Elbow flexion    Elbow extension    Wrist flexion 5 5  Wrist extension 5 4  Wrist ulnar deviation    Wrist radial deviation    Wrist pronation    Wrist supination    Grip strength     (Blank rows = not tested)    SPECIAL TESTS:  Cervical Spurling's test: Positive Pain to palpation in upper throacic and lower cervical segments, pushing on C6-7 did create some numbness and tingling in left hand.                                                                                                                              TREATMENT DATE:  Eval HEP creation and review with  demonstration and trial set preformed, see below for details Mechanical cervical traction 17-12# intermittent pull X 20 min    PATIENT EDUCATION: Education details: HEP, PT plan of care, selfcare Person educated: Patient Education method: Explanation, Demonstration, Verbal cues, and Handouts Education comprehension: verbalized understanding, further education recommended   HOME EXERCISE PROGRAM: Access Code: [email or web address] URL: [email or web address] Date: 06/19/2024 Prepared by: Redell Moose  Exercises - Seated Cervical Retraction  - 2 x daily - 6 x weekly - 2 sets - 10 reps - Seated Levator Scapulae Stretch  - 2 x daily - 6 x weekly - 1 sets - 3 reps - 30 sec hold - Seated Cervical Sidebending Stretch (Mirrored)  - 2 x daily - 6 x weekly - 1 sets - 3 reps - 30 sec hold - Seated Thoracic Self Mobilization  - 1 x daily - 7 x weekly - 1 sets - 10 reps - 5 sec hold - Seated Assisted Cervical Rotation with Towel  - 2 x daily - 6 x weekly - 1 sets - 10 reps - 5 sec hold  ASSESSMENT:  CLINICAL IMPRESSION: Patient referred to PT for neck pain with radiculopathy and left shoulder pain. She has some sings of possible bulging disk and facet arthropathy on left side. Patient will benefit from skilled PT to improve overall function and to address impairments and limitations listed below.  OBJECTIVE IMPAIRMENTS: decreased activity tolerance for ADL's, decreased mobility, decreased ROM, decreased strength, impaired flexibility, impaired UE use, and pain.  ACTIVITY LIMITATIONS:computer work, driving, reaching, carry, occupation, turning head  PERSONAL FACTORS: see above PMH  also affecting patient's functional outcome.  REHAB POTENTIAL: Good  CLINICAL DECISION MAKING: Stable/uncomplicated  EVALUATION COMPLEXITY: Low    GOALS: Short term PT Goals Target date: 07/17/2024   Pt will be I and compliant with HEP. Baseline:   Goal status: New Pt will decrease pain by 25% overall Baseline: Goal status: New  Long term PT goals Target date:07/31/2024   Pt will improve neck AROM to [facility] to improve functional mobility Baseline: Goal status: New Pt will improve  left arm strength to at least 4+/5 MMT to improve functional strength Baseline: Goal status: New Pt will improve Patient specific functional scale (PSFS) to at least 8/10 to show improved function level Baseline: Goal status: New Pt will reduce pain to overall less than 3/10 with usual activity and work activity. Baseline: Goal status: New  PLAN: PT FREQUENCY: 1-3 times per week   PT DURATION: 6-8 weeks  PLANNED INTERVENTIONS  97110-Therapeutic exercises, 97530- Therapeutic activity, [id number]- Neuromuscular re-education, 97535- Self Care, [id number]- Manual therapy, [id number]- Electrical stimulation (unattended), 02987- Traction (mechanical), [id number]- Ionotophoresis 4mg /ml Dexamethasone, and Patient/Family education  PLAN FOR NEXT SESSION: review HEP, assess response to traction.  NEXT MD VISIT: week of 10/27  Redell JONELLE Moose, PT,DPT 06/19/2024, 8:33 AM

## 2024-06-19 ENCOUNTER — Other Ambulatory Visit (HOSPITAL_BASED_OUTPATIENT_CLINIC_OR_DEPARTMENT_OTHER): Payer: Self-pay

## 2024-06-19 ENCOUNTER — Encounter: Payer: Self-pay | Admitting: Physical Therapy

## 2024-06-19 ENCOUNTER — Ambulatory Visit: Admitting: Physical Therapy

## 2024-06-19 DIAGNOSIS — M6281 Muscle weakness (generalized): Secondary | ICD-10-CM | POA: Insufficient documentation

## 2024-06-19 DIAGNOSIS — M542 Cervicalgia: Secondary | ICD-10-CM | POA: Insufficient documentation

## 2024-07-04 ENCOUNTER — Other Ambulatory Visit (HOSPITAL_BASED_OUTPATIENT_CLINIC_OR_DEPARTMENT_OTHER): Payer: Self-pay

## 2024-07-21 ENCOUNTER — Other Ambulatory Visit (HOSPITAL_BASED_OUTPATIENT_CLINIC_OR_DEPARTMENT_OTHER): Payer: Self-pay

## 2024-07-28 ENCOUNTER — Other Ambulatory Visit (HOSPITAL_BASED_OUTPATIENT_CLINIC_OR_DEPARTMENT_OTHER): Payer: Self-pay

## 2024-07-28 MED ORDER — ZOSTER VAC RECOMB ADJUVANTED 50 MCG/0.5ML IM SUSR
0.5000 mL | INTRAMUSCULAR | 0 refills | Status: AC
  Filled 2024-07-28: qty 0.5, 1d supply, fill #0

## 2024-08-29 ENCOUNTER — Other Ambulatory Visit (HOSPITAL_BASED_OUTPATIENT_CLINIC_OR_DEPARTMENT_OTHER): Payer: Self-pay

## 2024-09-01 ENCOUNTER — Other Ambulatory Visit (HOSPITAL_BASED_OUTPATIENT_CLINIC_OR_DEPARTMENT_OTHER): Payer: Self-pay

## 2024-09-04 ENCOUNTER — Other Ambulatory Visit (HOSPITAL_BASED_OUTPATIENT_CLINIC_OR_DEPARTMENT_OTHER): Payer: Self-pay

## 2024-09-04 MED ORDER — PANTOPRAZOLE SODIUM 40 MG PO TBEC
40.0000 mg | DELAYED_RELEASE_TABLET | Freq: Every day | ORAL | 3 refills | Status: AC
  Filled 2024-09-04: qty 30, 30d supply, fill #0
  Filled 2024-09-29: qty 30, 30d supply, fill #1

## 2024-09-11 ENCOUNTER — Other Ambulatory Visit (HOSPITAL_BASED_OUTPATIENT_CLINIC_OR_DEPARTMENT_OTHER): Payer: Self-pay

## 2024-09-14 ENCOUNTER — Other Ambulatory Visit (HOSPITAL_BASED_OUTPATIENT_CLINIC_OR_DEPARTMENT_OTHER): Payer: Self-pay

## 2024-09-29 ENCOUNTER — Other Ambulatory Visit (HOSPITAL_BASED_OUTPATIENT_CLINIC_OR_DEPARTMENT_OTHER): Payer: Self-pay

## 2024-10-02 ENCOUNTER — Other Ambulatory Visit (HOSPITAL_BASED_OUTPATIENT_CLINIC_OR_DEPARTMENT_OTHER): Payer: Self-pay

## 2024-10-02 MED ORDER — TRIAMCINOLONE ACETONIDE 0.1 % EX CREA: TOPICAL_CREAM | CUTANEOUS | 3 refills | Status: AC
